# Patient Record
Sex: Male | Born: 1975 | Race: White | Hispanic: No | Marital: Single | Smoking: Current every day smoker
Health system: Southern US, Community
[De-identification: ages and names within clinical notes are randomized; demographics above are authoritative.]

## PROBLEM LIST (undated history)

## (undated) DIAGNOSIS — F1910 Other psychoactive substance abuse, uncomplicated: Secondary | ICD-10-CM

## (undated) HISTORY — PX: FRACTURE SURGERY: SHX138

---

## 2016-10-14 ENCOUNTER — Encounter: Payer: Self-pay | Admitting: Emergency Medicine

## 2016-10-14 ENCOUNTER — Emergency Department: Payer: Self-pay

## 2016-10-14 ENCOUNTER — Emergency Department (HOSPITAL_COMMUNITY)
Admission: EM | Admit: 2016-10-14 | Discharge: 2016-10-15 | Disposition: A | Discharge status: Home / Self Care | Payer: Self-pay | Attending: Emergency Medicine | Admitting: Emergency Medicine

## 2016-10-14 ENCOUNTER — Emergency Department
Admission: EM | Admit: 2016-10-14 | Discharge: 2016-10-14 | Disposition: A | Discharge status: Home / Self Care | Payer: Self-pay | Attending: Emergency Medicine | Admitting: Emergency Medicine

## 2016-10-14 ENCOUNTER — Encounter (HOSPITAL_COMMUNITY): Payer: Self-pay

## 2016-10-14 DIAGNOSIS — F17200 Nicotine dependence, unspecified, uncomplicated: Secondary | ICD-10-CM | POA: Insufficient documentation

## 2016-10-14 DIAGNOSIS — F1410 Cocaine abuse, uncomplicated: Secondary | ICD-10-CM | POA: Diagnosis present

## 2016-10-14 DIAGNOSIS — F14151 Cocaine abuse with cocaine-induced psychotic disorder with hallucinations: Secondary | ICD-10-CM

## 2016-10-14 DIAGNOSIS — F341 Dysthymic disorder: Secondary | ICD-10-CM

## 2016-10-14 DIAGNOSIS — R45851 Suicidal ideations: Secondary | ICD-10-CM

## 2016-10-14 DIAGNOSIS — R911 Solitary pulmonary nodule: Secondary | ICD-10-CM

## 2016-10-14 DIAGNOSIS — R079 Chest pain, unspecified: Secondary | ICD-10-CM

## 2016-10-14 DIAGNOSIS — F1910 Other psychoactive substance abuse, uncomplicated: Secondary | ICD-10-CM

## 2016-10-14 DIAGNOSIS — F17210 Nicotine dependence, cigarettes, uncomplicated: Secondary | ICD-10-CM | POA: Insufficient documentation

## 2016-10-14 HISTORY — DX: Other psychoactive substance abuse, uncomplicated: F19.10

## 2016-10-14 LAB — LIPASE, BLOOD: Lipase: 21 U/L (ref 11–51)

## 2016-10-14 LAB — COMPREHENSIVE METABOLIC PANEL
ALBUMIN: 4.3 g/dL (ref 3.5–5.0)
ALK PHOS: 62 U/L (ref 38–126)
ALK PHOS: 67 U/L (ref 38–126)
ALT: 21 U/L (ref 17–63)
ALT: 21 U/L (ref 17–63)
ANION GAP: 9 (ref 5–15)
AST: 18 U/L (ref 15–41)
AST: 19 U/L (ref 15–41)
Albumin: 4.3 g/dL (ref 3.5–5.0)
Anion gap: 10 (ref 5–15)
BILIRUBIN TOTAL: 0.6 mg/dL (ref 0.3–1.2)
BUN: 14 mg/dL (ref 6–20)
BUN: 12 mg/dL (ref 6–20)
CALCIUM: 9.3 mg/dL (ref 8.9–10.3)
CALCIUM: 9.3 mg/dL (ref 8.9–10.3)
CHLORIDE: 101 mmol/L (ref 101–111)
CO2: 23 mmol/L (ref 22–32)
CO2: 26 mmol/L (ref 22–32)
CREATININE: 0.77 mg/dL (ref 0.61–1.24)
Chloride: 105 mmol/L (ref 101–111)
Creatinine, Ser: 0.89 mg/dL (ref 0.61–1.24)
GFR calc Af Amer: >60 mL/min (ref >60)
GFR calc Af Amer: >60 mL/min (ref >60)
GFR calc non Af Amer: >60 mL/min (ref >60)
GLUCOSE: 97 mg/dL (ref 65–99)
Glucose, Bld: 85 mg/dL (ref 65–99)
Potassium: 3.8 mmol/L (ref 3.5–5.1)
Potassium: 3.6 mmol/L (ref 3.5–5.1)
SODIUM: 137 mmol/L (ref 135–145)
Sodium: 137 mmol/L (ref 135–145)
Total Bilirubin: 1.4 mg/dL — ABNORMAL HIGH (ref 0.3–1.2)
Total Protein: 7.5 g/dL (ref 6.5–8.1)
Total Protein: 7.5 g/dL (ref 6.5–8.1)

## 2016-10-14 LAB — RAPID URINE DRUG SCREEN, HOSP PERFORMED
AMPHETAMINES: NOT DETECTED
Barbiturates: NOT DETECTED
Benzodiazepines: POSITIVE — AB
Cocaine: POSITIVE — AB
Opiates: NOT DETECTED
TETRAHYDROCANNABINOL: POSITIVE — AB

## 2016-10-14 LAB — CBC WITH DIFFERENTIAL/PLATELET
Basophils Absolute: 0.1 10*3/uL (ref 0.0–0.1)
Basophils Relative: 0 %
Eosinophils Absolute: 0.2 10*3/uL (ref 0.0–0.7)
Eosinophils Relative: 2 %
HEMATOCRIT: 45.5 % (ref 39.0–52.0)
HEMOGLOBIN: 15.7 g/dL (ref 13.0–17.0)
LYMPHS ABS: 2.6 10*3/uL (ref 0.7–4.0)
Lymphocytes Relative: 22 %
MCH: 31.6 pg (ref 26.0–34.0)
MCHC: 34.5 g/dL (ref 30.0–36.0)
MCV: 91.5 fL (ref 78.0–100.0)
MONO ABS: 0.8 10*3/uL (ref 0.1–1.0)
MONOS PCT: 7 %
NEUTROS ABS: 8 10*3/uL — AB (ref 1.7–7.7)
NEUTROS PCT: 69 %
Platelets: 305 10*3/uL (ref 150–400)
RBC: 4.97 MIL/uL (ref 4.22–5.81)
RDW: 12.8 % (ref 11.5–15.5)
WBC: 11.7 10*3/uL — ABNORMAL HIGH (ref 4.0–10.5)

## 2016-10-14 LAB — CBC
HEMATOCRIT: 45.1 % (ref 40.0–52.0)
HEMOGLOBIN: 15.7 g/dL (ref 13.0–18.0)
MCH: 31.8 pg (ref 26.0–34.0)
MCHC: 34.9 g/dL (ref 32.0–36.0)
MCV: 91.2 fL (ref 80.0–100.0)
Platelets: 324 10*3/uL (ref 150–440)
RBC: 4.95 MIL/uL (ref 4.40–5.90)
RDW: 13.2 % (ref 11.5–14.5)
WBC: 13.7 10*3/uL — AB (ref 3.8–10.6)

## 2016-10-14 LAB — TROPONIN I

## 2016-10-14 LAB — GLUCOSE, CAPILLARY
GLUCOSE-CAPILLARY: 93 mg/dL (ref 65–99)
Glucose-Capillary: 88 mg/dL (ref 65–99)

## 2016-10-14 LAB — ETHANOL

## 2016-10-14 MED ORDER — IOPAMIDOL (ISOVUE-370) INJECTION 76%: 75.0000 mL | Freq: Once | INTRAVENOUS | Status: DC | PRN

## 2016-10-14 MED ORDER — SODIUM CHLORIDE 0.9 % IV BOLUS (SEPSIS)
500.0000 mL | Freq: Once | INTRAVENOUS | Status: AC
  Administered 2016-10-14: 500 mL via INTRAVENOUS

## 2016-10-14 MED ORDER — LORAZEPAM 2 MG/ML IJ SOLN
1.0000 mg | Freq: Once | INTRAMUSCULAR | Status: AC
  Administered 2016-10-14: 1 mg via INTRAVENOUS
  Filled 2016-10-14: qty 1

## 2016-10-14 MED ORDER — NITROGLYCERIN 2 % TD OINT
1.0000 [in_us] | TOPICAL_OINTMENT | TRANSDERMAL | Status: AC
  Administered 2016-10-14: 1 [in_us] via TOPICAL
  Filled 2016-10-14: qty 1

## 2016-10-14 MED ORDER — IOPAMIDOL (ISOVUE-370) INJECTION 76%
75.0000 mL | Freq: Once | INTRAVENOUS | Status: AC | PRN
Start: 2016-10-14 — End: 2016-10-14
  Administered 2016-10-14: 75 mL via INTRAVENOUS

## 2016-10-14 MED ORDER — LORAZEPAM 2 MG/ML IJ SOLN
1.0000 mg | Freq: Once | INTRAMUSCULAR | Status: AC
  Administered 2016-10-14: 1 mg via INTRAMUSCULAR
  Filled 2016-10-14: qty 1

## 2016-10-14 NOTE — ED Notes (Signed)
Pt inquiring about rehab. RN asked Dr. Fanny BienQuale, Dr. Fanny BienQuale advised that pt should call [facility] to see if he can get back in. Pt informed that he needed to contact [facility]. Pt states that he knows that they will not take him back since he left last night and used again. Pt upset and left room without signing for d/c. Pt did take d/c papers with him.

## 2016-10-14 NOTE — BH Assessment (Addendum)
Tele Assessment Note   Christopher [name] is an 41 y.o. male, who presents voluntary and unaccompanied to [facility]. Pt reported, a week ago he took 20, 50mg  Adderall tablets as a suicide attempt. Pt reported, he did not seek medical attention. Pt reported, current passive suicidal thoughts with no plan. Pt reported, his family (wife, mother, three boys, dad, step-dad and brother) were killed in a car accident about a year ago, while he was at work out of town. Pt reported, hearing the voice of his kids saying, "daddy," however no one is there. Pt reported, slitting his wrist and stabbing himself in the stomach. Pt reported, a previous suicide attempt in 2004 where he hung himself and had to be revived. Pt denies, HI, access to weapons.   Pt denied abuse.  Pt reported, using two grams of cocaine, and drinking a case of beer and half a fifth of Christiane Ha, last night. Pt reported, smoking, two and a half pack of cigarettes, and smoking 5-6 joint, daily. Pt's UDS and BAL are pending. Pt denied being linked to OPT resources (medication management and/or counseling.) Pt reported, previous inpatient admissions. Pt reported, in 2005, he was admitted to [facility], he stayed there for a while then relapsed. Pt reported, he got clean again however, after thinking about his family, the pt relapsed. Pt reported, leaving [facility] because he was in his own head. Pt reported, he can not come back to [facility] for 30 days.   Pt presented crying in scrubs with logical/coherent speech. Pt's eye contact was good. Pt's mood was depressed/helpess. Pt's affect was congruent with mood. Pt's thought process was coherent/relavent. Pt's judgement was partial. Pt's concentration was normal. Pt's insight was fair. Pt;'s impulse control was poor. Pt was oriented x3, (year, city and state.) Pt reported, if discharged from [facility] he could not contract for safety. Pt reported, if inpatient treatment was recommended would sign-in voluntarily.   Diagnosis:  Substance-Induced Mood Disorder.  Past Medical History:  Past Medical History:  Diagnosis Date  . Substance abuse     Past Surgical History:  Procedure Laterality Date  . FRACTURE SURGERY      Family History: History reviewed. No pertinent family history.  Social History:  reports that he has been smoking.  He uses smokeless tobacco. He reports that he drinks alcohol. He reports that he uses drugs, including Cocaine.  Additional Social History:  Alcohol / Drug Use Pain Medications: See MAR Prescriptions: See MAR Over the Counter: See MAR History of alcohol / drug use?: Yes Substance #1 Name of Substance 1: Marijuana 1 - Age of First Use: UTA 1 - Amount (size/oz): Pt reported, smoking 5-6 joints, daily.  1 - Frequency: UTA 1 - Duration: UTA 1 - Last Use / Amount: Pt reported, daily.  Substance #2 Name of Substance 2: Cocaine 2 - Age of First Use: UTA 2 - Amount (size/oz): Pt reported, using two grams of cocaine, last night.  2 - Frequency: UTA 2 - Duration: UTA 2 - Last Use / Amount: Pt reported, last night. Substance #3 Name of Substance 3: Alcohol 3 - Age of First Use: UTA 3 - Amount (size/oz): Pt reported, drinking a case of beer and half fifth of Christiane Ha, last night. 3 - Frequency: UTA 3 - Duration: UTA 3 - Last Use / Amount: Pt reported, last night.  Substance #4 Name of Substance 4: Cigarettes 4 - Age of First Use: UTA 4 - Amount (size/oz): Pt reportedm smoking, two and a half  pack of cigarettes, daily.  4 - Frequency: UTA 4 - Duration: UTA 4 - Last Use / Amount: Pt reported, daily.   CIWA: CIWA-Ar BP: 134/88 Pulse Rate: 92 COWS:    PATIENT STRENGTHS: (choose at least two) Average or above average intelligence General fund of knowledge Motivation for treatment/growth  Allergies:  Allergies  Allergen Reactions  . Ibuprofen Hives    Swelling and hives    Home Medications:  (Not in a hospital admission)  OB/GYN Status:  No LMP for male  patient.  General Assessment Data Location of Assessment: [facility] Assessment: In system Is this a Tele or Face-to-Face Assessment?: Face-to-Face Is this an Initial Assessment or a Re-assessment for this encounter?: Initial Assessment Marital status: Widowed Living Arrangements: Alone Can pt return to current living arrangement?: Yes Admission Status: Voluntary Is patient capable of signing voluntary admission?: Yes Referral Source: Self/Family/Friend Insurance type: Self-pay     Crisis Care Plan Living Arrangements: Alone Legal Guardian: Other: (Self) Name of Psychiatrist: NA Name of Therapist: NA  Education Status Is patient currently in school?: No Current Grade: NA Highest grade of school patient has completed: 12th grade.  Name of school: NA Contact person: NA  Risk to self with the past 6 months Suicidal Ideation: No-Not Currently/Within Last 6 Months Has patient been a risk to self within the past 6 months prior to admission? : Yes Suicidal Intent: No-Not Currently/Within Last 6 Months Has patient had any suicidal intent within the past 6 months prior to admission? : Yes Is patient at risk for suicide?: No Suicidal Plan?: No-Not Currently/Within Last 6 Months Has patient had any suicidal plan within the past 6 months prior to admission? : Yes Access to Means: Yes Specify Access to Suicidal Means: Adderall tablets.  What has been your use of drugs/alcohol within the last 12 months?: Alcohol, cocaine, marijuana and cigarettes. Previous Attempts/Gestures: Yes How many times?: 2 Other Self Harm Risks: Cutting his wrists.  Triggers for Past Attempts: Unpredictable Intentional Self Injurious Behavior: Cutting Comment - Self Injurious Behavior: Pt reported, sliting his wrists.  Family Suicide History: Unable to assess Recent stressful life event(s): Loss (Comment) (family died in car accident. ) Persecutory voices/beliefs?: No Depression: Yes Depression Symptoms:  Loss of interest in usual pleasures, Guilt, Isolating, Tearfulness, Fatigue, Feeling worthless/self pity Substance abuse history and/or treatment for substance abuse?: Yes Suicide prevention information given to non-admitted patients: Not applicable  Risk to Others within the past 6 months Homicidal Ideation: No (Pt denies. ) Does patient have any lifetime risk of violence toward others beyond the six months prior to admission? : No Thoughts of Harm to Others: No Current Homicidal Intent: No Current Homicidal Plan: No Access to Homicidal Means: No Identified Victim: NA History of harm to others?: No Assessment of Violence:  (UTA) Violent Behavior Description: Pt reported, getting into fights.  Does patient have access to weapons?: No Criminal Charges Pending?: No Does patient have a court date: No Is patient on probation?: No  Psychosis Hallucinations: Auditory Delusions: None noted  Mental Status Report Appearance/Hygiene: In scrubs Eye Contact: Good Motor Activity: Unremarkable Speech: Logical/coherent Level of Consciousness: Crying Mood: Depressed, Helpless, Sad Affect: Other (Comment) ( congruent with mood.) Anxiety Level: Moderate Thought Processes: Coherent, Relevant Judgement: Partial Orientation: Other (Comment) (year, city and state. ) Obsessive Compulsive Thoughts/Behaviors: None  Cognitive Functioning Concentration: Normal Memory: Recent Intact IQ: Average Insight: Fair Impulse Control: Poor Appetite: Poor Weight Loss:  (UTA) Weight Gain:  (UTA) Sleep: Decreased Total  Hours of Sleep:  (UTA) Vegetative Symptoms: None  ADLScreening [facility] Assessment Services) Patient's cognitive ability adequate to safely complete daily activities?: Yes Patient able to express need for assistance with ADLs?: Yes Independently performs ADLs?: Yes (appropriate for developmental age)  Prior Inpatient Therapy Prior Inpatient Therapy: Yes Prior Therapy Dates: 2005,  2018. Prior Therapy Facilty/Provider(s):  [facility], [facility]. Reason for Treatment: substance abuse treatment.  Prior Outpatient Therapy Prior Outpatient Therapy: No Prior Therapy Dates: NA Prior Therapy Facilty/Provider(s): NA Reason for Treatment: NA Does patient have an ACCT team?: No Does patient have Intensive In-House Services?  : No Does patient have [facility] services? : No Does patient have P4CC services?: No  ADL Screening (condition at time of admission) Patient's cognitive ability adequate to safely complete daily activities?: Yes Is the patient deaf or have difficulty hearing?: No Does the patient have difficulty seeing, even when wearing glasses/contacts?: No Does the patient have difficulty concentrating, remembering, or making decisions?: Yes Patient able to express need for assistance with ADLs?: Yes Does the patient have difficulty dressing or bathing?: No Independently performs ADLs?: Yes (appropriate for developmental age) Does the patient have difficulty walking or climbing stairs?: No Weakness of Legs: Both Weakness of Arms/Hands: Left       Abuse/Neglect Assessment (Assessment to be complete while patient is alone) Physical Abuse: Denies (Pt denies. ) Verbal Abuse: Denies (Pt denies.) Sexual Abuse: Denies (Pt denies.) Exploitation of patient/patient's resources: Denies (Pt denies.) Self-Neglect: Denies (Pt denies.)     Advance Directives (For Healthcare) Does Patient Have a Medical Advance Directive?: No Would patient like information on creating a medical advance directive?: No - Patient declined    Additional Information 1:1 In Past 12 Months?: No CIRT Risk: No Elopement Risk: No Does patient have medical clearance?: Yes     Disposition: Nira Conn, NP recommends overnight observation and re-evaluation the morning. Disposition discussed with Huntley Dec, RN.    Disposition Initial Assessment Completed for this Encounter: Yes Disposition of Patient: Other  dispositions (AM Psychiatric Evaluation. ) Other disposition(s): Other (Comment) (AM Psychiatric Evaluation. )  Redmond Pulling 10/14/2016 11:20 PM   Redmond Pulling, MS, [facility], [facility] Triage Specialist [phone]

## 2016-10-14 NOTE — ED Provider Notes (Signed)
[facility]-EMERGENCY DEPT Provider Note   CSN: [id number] Arrival date & time: 10/14/16  1720     History   Chief Complaint Chief Complaint  Patient presents with  . Depression    HPI Christopher [name] is a 41 y.o. male.  HPI  Patient presents with concern of depression, suicidal ideation and anxiety. Patient notes a history of polysubstance abuse, states that he was clean until the past 2 days. During that time he has been using cocaine, alcohol, marijuana. One week ago the patient made a suicide attempt, taking approximately 20 tablets of Adderall. Today, he denies suicide plan, states that he feels as though he wants to end it all, feels depressed. Notably, the patient was seen at our affiliated facility earlier today, evaluated for chest pain.  He notes that since that time he has had worsening depression, and wants get help (with both depression and substance abuse).    Past Medical History:  Diagnosis Date  . Substance abuse     There are no active problems to display for this patient.   Past Surgical History:  Procedure Laterality Date  . FRACTURE SURGERY         Home Medications    Prior to Admission medications   Not on File    Family History History reviewed. No pertinent family history.  Social History Social History  Substance Use Topics  . Smoking status: Current Every Day Smoker  . Smokeless tobacco: Current User  . Alcohol use Yes     Allergies   Ibuprofen   Review of Systems Review of Systems  Constitutional:       Per HPI, otherwise negative  HENT:       Per HPI, otherwise negative  Respiratory:       Per HPI, otherwise negative  Cardiovascular:       Per HPI, otherwise negative  Gastrointestinal: Negative for vomiting.  Endocrine:       Negative aside from HPI  Genitourinary:       Neg aside from HPI   Musculoskeletal:       Per HPI, otherwise negative  Skin: Negative.   Neurological: Negative for syncope.    Psychiatric/Behavioral: Positive for dysphoric mood, sleep disturbance and suicidal ideas. The patient is nervous/anxious.      Physical Exam Updated Vital Signs BP (!) 141/94 (BP Location: Left Arm)   Pulse (!) 112   Temp 98.1 F (36.7 C) (Oral)   Resp 16   SpO2 98%   Physical Exam  Constitutional: He is oriented to person, place, and time. He appears well-developed. No distress.  HENT:  Head: Normocephalic and atraumatic.  Eyes: Conjunctivae and EOM are normal.  Cardiovascular: Normal rate and regular rhythm.   Pulmonary/Chest: Effort normal. No stridor. No respiratory distress.  Abdominal: He exhibits no distension.  Musculoskeletal: He exhibits no edema.  Neurological: He is alert and oriented to person, place, and time.  Skin: Skin is warm and dry.  Psychiatric: His mood appears anxious. He exhibits a depressed mood. He expresses suicidal ideation. He expresses no suicidal plans.  Nursing note and vitals reviewed.    ED Treatments / Results  Labs (all labs ordered are listed, but only abnormal results are displayed) Labs Reviewed  COMPREHENSIVE METABOLIC PANEL - Abnormal; Notable for the following:       Result Value   Total Bilirubin 1.4 (*)    All other components within normal limits  RAPID URINE DRUG SCREEN, HOSP PERFORMED - Abnormal; Notable for the  following:    Cocaine POSITIVE (*)    Benzodiazepines POSITIVE (*)    Tetrahydrocannabinol POSITIVE (*)    All other components within normal limits  CBC WITH DIFFERENTIAL/PLATELET - Abnormal; Notable for the following:    WBC 11.7 (*)    Neutro Abs 8.0 (*)    All other components within normal limits  ETHANOL     Radiology Dg Chest 1 View  Result Date: 10/14/2016 CLINICAL DATA:  Chest pain.  Cocaine last night EXAM: CHEST 1 VIEW COMPARISON:  None. FINDINGS: Normal heart size and mediastinal contours. No acute infiltrate or edema. No effusion or pneumothorax. No acute osseous findings. IMPRESSION: Negative  portable chest. Electronically Signed   By: Marnee SpringJonathon  Watts M.D.   On: 10/14/2016 08:27   Ct Angio Chest Aorta W And/or Wo Contrast  Result Date: 10/14/2016 CLINICAL DATA:  Chest pain, history of cocaine and ethanol use with cocaine most recently taken 1 hour ago, possible aortic disease, history smoking EXAM: CT ANGIOGRAPHY CHEST WITH CONTRAST TECHNIQUE: Multidetector CT imaging of the chest was performed using the standard protocol during bolus administration of intravenous contrast. Multiplanar CT image reconstructions and MIPs were obtained to evaluate the vascular anatomy. CONTRAST:  75 cc Isovue 370 IV COMPARISON:  None FINDINGS: Cardiovascular: Precontrast images demonstrate normal aortic caliber without evidence of intramural hematoma or surrounding hemorrhage. Normal aortic enhancement post contrast. No evidence of aortic aneurysm or dissection. Pulmonary artery suboptimally opacified on aortic exam, grossly unremarkable. No pericardial effusion. Mediastinum/Nodes: Small hiatal hernia. Esophagus questionably demonstrates mild wall thickening versus artifact from underdistention. Minimally enlarged lymph node adjacent to RIGHT mainstem bronchus 11 mm short axis image 42. No additional thoracic adenopathy. Base of cervical region unremarkable. Lungs/Pleura: Minimal dependent atelectasis at lung bases. 2 mm RIGHT lower lobe nodule image 26. Mild central peribronchial thickening. No pulmonary infiltrate, pleural effusion, or pneumothorax. Upper Abdomen: Normal appearance Musculoskeletal: Unremarkable Review of the MIP images confirms the above findings. IMPRESSION: Minimal dependent atelectasis in the lower lobes. Single nonspecific minimally enlarged 4R RIGHT lymph node. 2 mm RIGHT lower lobe nodule, recommendation below. No follow-up needed if patient is low-risk. Non-contrast chest CT can be considered in 12 months if patient is high-risk. This recommendation follows the consensus statement: Guidelines  for Management of Incidental Pulmonary Nodules Detected on CT Images: From the Fleischner Society 2017; Radiology 2017; 284:228-243. Electronically Signed   By: Ulyses SouthwardMark  Boles M.D.   On: 10/14/2016 09:46    Procedures Procedures (including critical care time)  Medications Ordered in ED Medications  LORazepam (ATIVAN) injection 1 mg (not administered)     Initial Impression / Assessment and Plan / ED Course  I have reviewed the triage vital signs and the nursing notes.  Pertinent labs & imaging results that were available during my care of the patient were reviewed by me and considered in my medical decision making (see chart for details).  EMR reviewed, notable for ACS / PE eval w reassuring results.  Patient with history of depression, polysubstance abuse presents after episode of suicide attempt one week ago, now with ongoing suicidal ideation, depression, and substance abuse Patient has multiple risk factors for suicide.  Final Clinical Impressions(s) / ED Diagnoses  Depression Polysubstance abuse   Gerhard MunchLockwood, Darryle Dennie, MD 10/14/16 2359

## 2016-10-14 NOTE — ED Triage Notes (Signed)
Pt went to [facility] and discharge.  Wants rehab for beer, cocaine, adderall.  Recent death in family causing relapse.

## 2016-10-14 NOTE — ED Notes (Signed)
TTS at bedside. 

## 2016-10-14 NOTE — ED Triage Notes (Signed)
[place] [facility] [place] Per EMS pt c/o chest pain 7/10. Pt was given 324 mg of ASA by EMS. On arrival pt admits to using cocaine and ETOH. Pt states that he was in rehab for 3 days but left last night and was using cocaine and drinking, pt states that he did about 2 grams of cocaine, last used about 1 hour PTA. Pt states that he drank approximately a case of beer.

## 2016-10-14 NOTE — Discharge Instructions (Addendum)
You have been seen in the Emergency Department (ED) today for chest pain likely due to your cocaine use.  As we have discussed today?s test results are normal, but you may require further testing. STOP using cocaine, this could lead to a massive heart attack and death.  Please follow up with the recommended doctor as instructed above in these documents regarding today?s emergent visit and your recent symptoms to discuss further management.  Continue to take your regular medications. If you are not doing so already, please also take a daily baby aspirin (81 mg), at least until you follow up with your doctor.  Return to the Emergency Department (ED) if you experience any further chest pain/pressure/tightness, difficulty breathing, or sudden sweating, or other symptoms that concern you.    Please follow-up with a primary care doctor within 12 months for repeat CT scan of the lungs to make sure that the nodule seen in the right lung is not increasing in size. This is very important to ensure it is not becoming precancerous.

## 2016-10-14 NOTE — ED Provider Notes (Signed)
[facility] Emergency Department Provider Note   ____________________________________________   First MD Initiated Contact with Patient 10/14/16 [phone]     (approximate)  I have reviewed the triage vital signs and the nursing notes.   HISTORY  Chief Complaint Chest Pain    HPI Christopher [name] is a 41 y.o. male began having chest pain about one hour ago  Patient reports that he drank a few beers last night, then used cocaine and began experiencing chest pain that only a few minutes of starting cocaine. He then walked to the train station but his pain worsened prompting a call to 911.  Reports he was recently in rehabilitation and went through 3 days of rehabilitation and detox for alcohol, opioids, and cocaine abuse and left the program last night.  Reports he began experiencing a pain in the mid chest that radiated slightly towards the left arm. He then began experiencing pain seemed to move towards his back and is now seated as a sharp discomfort and pain in his left shoulder blade area. Reports he's never had symptoms like this before. He has no known medical problems except for substance abuse history.  Reports he had some nausea associated but no vomiting.  Currently reports pain is moderate to severe and located in the left back and shoulder blade area   Past Medical History:  Diagnosis Date  . Substance abuse     There are no active problems to display for this patient.   Past Surgical History:  Procedure Laterality Date  . FRACTURE SURGERY      Prior to Admission medications   Not on File  None  Allergies Ibuprofen  No family history on file.  Social History Social History  Substance Use Topics  . Smoking status: Current Every Day Smoker  . Smokeless tobacco: Current User  . Alcohol use Yes    Review of Systems Constitutional: No fever/chills Eyes: No visual changes. ENT: No sore throat. Cardiovascular: See history of  present illness Respiratory: Denies shortness of breath. Gastrointestinal: No abdominal pain.   No diarrhea.  No constipation. Genitourinary: Negative for dysuria. Musculoskeletal: Negative for back pain. Skin: Negative for rash. Neurological: Negative for headaches, focal weakness or numbness.    ____________________________________________   PHYSICAL EXAM:  VITAL SIGNS: ED Triage Vitals  Enc Vitals Group     BP 10/14/16 0742 131/88     Pulse Rate 10/14/16 0742 (!) 124     Resp 10/14/16 0742 13     Temp 10/14/16 0742 98.7 F (37.1 C)     Temp Source 10/14/16 0742 Oral     SpO2 10/14/16 0739 94 %     Weight 10/14/16 0743 215 lb (97.5 kg)     Height 10/14/16 0743 6\' 2"  (1.88 m)     Head Circumference --      Peak Flow --      Pain Score 10/14/16 0741 7     Pain Loc --      Pain Edu? --      Excl. in GC? --     Constitutional: Alert and oriented. Appears in pain, eyes or wincing. Reports pain in the left back and shoulder area at this time. No falls or injury. Eyes: Conjunctivae are normal. Head: Atraumatic. Nose: No congestion/rhinnorhea. Mouth/Throat: Mucous membranes are moist. Neck: No stridor.  Denies any pain in the neck. Cardiovascular: Tachycardic rate, regular rhythm. Grossly normal heart sounds.  Good peripheral circulation. No reproducible chest pain to palpation. Respiratory:  Normal respiratory effort.  No retractions. Lungs CTAB. Gastrointestinal: Soft and nontender. No distention. Musculoskeletal: No lower extremity tenderness nor edema. Neurologic:  Normal speech and language. No gross focal neurologic deficits are appreciated.  Skin:  Skin is warm, dry and intact. No rash noted. The small abrasion is noted to the right lower shin without signs of complication or superinfection. Psychiatric: Mood and affect are normal. Speech and behavior are normal.  ____________________________________________   LABS (all labs ordered are listed, but only abnormal  results are displayed)  Labs Reviewed  CBC - Abnormal; Notable for the following:       Result Value   WBC 13.7 (*)    All other components within normal limits  TROPONIN I  TROPONIN I  COMPREHENSIVE METABOLIC PANEL  LIPASE, BLOOD  GLUCOSE, CAPILLARY  GLUCOSE, CAPILLARY  CBG MONITORING, ED   ____________________________________________  EKG  ED ECG REPORT I, Elya Diloreto, the attending physician, personally viewed and interpreted this ECG.  Date: 10/14/2016 EKG Time: 740 Rate: 115 Rhythm: normal sinus rhythm QRS Axis: normal Intervals: normal ST/T Wave abnormalities: normal Narrative Interpretation: unremarkable  ____________________________________________  RADIOLOGY  Dg Chest 1 View  Result Date: 10/14/2016 CLINICAL DATA:  Chest pain.  Cocaine last night EXAM: CHEST 1 VIEW COMPARISON:  None. FINDINGS: Normal heart size and mediastinal contours. No acute infiltrate or edema. No effusion or pneumothorax. No acute osseous findings. IMPRESSION: Negative portable chest. Electronically Signed   By: Marnee SpringJonathon  Watts M.D.   On: 10/14/2016 08:27   Ct Angio Chest Aorta W And/or Wo Contrast  Result Date: 10/14/2016 CLINICAL DATA:  Chest pain, history of cocaine and ethanol use with cocaine most recently taken 1 hour ago, possible aortic disease, history smoking EXAM: CT ANGIOGRAPHY CHEST WITH CONTRAST TECHNIQUE: Multidetector CT imaging of the chest was performed using the standard protocol during bolus administration of intravenous contrast. Multiplanar CT image reconstructions and MIPs were obtained to evaluate the vascular anatomy. CONTRAST:  75 cc Isovue 370 IV COMPARISON:  None FINDINGS: Cardiovascular: Precontrast images demonstrate normal aortic caliber without evidence of intramural hematoma or surrounding hemorrhage. Normal aortic enhancement post contrast. No evidence of aortic aneurysm or dissection. Pulmonary artery suboptimally opacified on aortic exam, grossly  unremarkable. No pericardial effusion. Mediastinum/Nodes: Small hiatal hernia. Esophagus questionably demonstrates mild wall thickening versus artifact from underdistention. Minimally enlarged lymph node adjacent to RIGHT mainstem bronchus 11 mm short axis image 42. No additional thoracic adenopathy. Base of cervical region unremarkable. Lungs/Pleura: Minimal dependent atelectasis at lung bases. 2 mm RIGHT lower lobe nodule image 26. Mild central peribronchial thickening. No pulmonary infiltrate, pleural effusion, or pneumothorax. Upper Abdomen: Normal appearance Musculoskeletal: Unremarkable Review of the MIP images confirms the above findings. IMPRESSION: Minimal dependent atelectasis in the lower lobes. Single nonspecific minimally enlarged 4R RIGHT lymph node. 2 mm RIGHT lower lobe nodule, recommendation below. No follow-up needed if patient is low-risk. Non-contrast chest CT can be considered in 12 months if patient is high-risk. This recommendation follows the consensus statement: Guidelines for Management of Incidental Pulmonary Nodules Detected on CT Images: From the Fleischner Society 2017; Radiology 2017; 284:228-243. Electronically Signed   By: Ulyses SouthwardMark  Boles M.D.   On: 10/14/2016 09:46    ____________________________________________   PROCEDURES  Procedure(s) performed: None  Procedures  Critical Care performed: none  ____________________________________________   INITIAL IMPRESSION / ASSESSMENT AND PLAN / ED COURSE  Pertinent labs & imaging results that were available during my care of the patient were reviewed by me and considered in  my medical decision making (see chart for details).  Patient presents for evaluation of chest pain. The etiology and description of the pain is concerning for possible coronary syndrome, but given his associated cocaine use is also highly suspect for vasospasm and/or dissection. No obtain CT angiography of the chest to exclude dissection. He is been  given aspirin by EMS  His initial EKG shows no evidence of ischemic changes. We'll await lab testing. Reevaluation. Treat with Ativan, nitrates at this time.    ----------------------------------------- 11:41 AM on 10/14/2016 -----------------------------------------  Repeat EKG performed at 12:10 AM Heart rate 80 QRS 80 QTc 4:30 Normal sinus rhythm, no evidence of ischemia or ectopy noted  Patient is resting comfortably, pain-free at this time.   Discussed the patient's clinical history, labs and CT scan with Dr. Kirke Corin of cardiology. He advises discharging the patient to follow-up and discontinuation of cocaine. Patient was counseled on this, notify the cocaine may lead to massive heart attack and death, patient knowledge is this and is agreeable with plan for discharge and follow-up.   ____________________________________________   FINAL CLINICAL IMPRESSION(S) / ED DIAGNOSES  Final diagnoses:  Chest pain  Lung nodule  Cocaine abuse      NEW MEDICATIONS STARTED DURING THIS VISIT:  New Prescriptions   No medications on file     Note:  This document was prepared using Dragon voice recognition software and may include unintentional dictation errors.     Sharyn Creamer, MD 10/14/16 [phone]

## 2016-10-14 NOTE — ED Triage Notes (Signed)
Per EMS, pt from truck stop.  Pt seen earlier at [facility]. Per note seen for cocaine and drinking.  Pt states he wants help.  Pt having nausea and left arm pain.  Vitals:  138/106, hr 15, 98% ra

## 2016-10-15 ENCOUNTER — Ambulatory Visit (HOSPITAL_COMMUNITY)
Admission: RE | Admit: 2016-10-15 | Discharge: 2016-10-15 | Disposition: A | Discharge status: Home / Self Care | Payer: Self-pay | Attending: Psychiatry | Admitting: Psychiatry

## 2016-10-15 ENCOUNTER — Encounter (HOSPITAL_COMMUNITY): Payer: Self-pay | Admitting: Emergency Medicine

## 2016-10-15 DIAGNOSIS — F14151 Cocaine abuse with cocaine-induced psychotic disorder with hallucinations: Secondary | ICD-10-CM

## 2016-10-15 DIAGNOSIS — F17210 Nicotine dependence, cigarettes, uncomplicated: Secondary | ICD-10-CM

## 2016-10-15 DIAGNOSIS — F1410 Cocaine abuse, uncomplicated: Secondary | ICD-10-CM | POA: Diagnosis present

## 2016-10-15 DIAGNOSIS — F1910 Other psychoactive substance abuse, uncomplicated: Secondary | ICD-10-CM

## 2016-10-15 MED ORDER — LORAZEPAM 1 MG PO TABS: 1.0000 mg | ORAL_TABLET | Freq: Four times a day (QID) | ORAL | Status: DC | PRN

## 2016-10-15 NOTE — ED Notes (Signed)
Hourly rounding reveals patient sleeping in room. No complaints, stable, in no acute distress. Q15 minute rounds and monitoring via [employer] Cam[employer] to continue. 

## 2016-10-15 NOTE — ED Notes (Signed)
[facility] Expected date:  Expected time:  Means of arrival:  Comments: Margo AyeHall C

## 2016-10-15 NOTE — [facility] Suicide Risk Assessment (Signed)
Suicide Risk Assessment  Discharge Assessment   [facility]   Principal Problem: Cocaine abuse with cocaine-induced psychotic disorder [facility](HCC) Discharge Diagnoses:  Patient Active Problem List   Diagnosis Date Noted  . Cocaine abuse with cocaine-induced psychotic disorder [facility](HCC) [F14.159] 10/15/2016    Priority: High    Total Time spent with patient: 45 minutes  Musculoskeletal: Strength & Muscle Tone: within normal limits Gait & Station: normal Patient leans: N/A  Psychiatric Specialty Exam: Physical Exam  Constitutional: He is oriented to person, place, and time. He appears well-developed and well-nourished.  HENT:  Head: Normocephalic.  Neck: Normal range of motion.  Respiratory: Effort normal.  Musculoskeletal: Normal range of motion.  Neurological: He is alert and oriented to person, place, and time.  Psychiatric: He has a normal mood and affect. His speech is normal and behavior is normal. Judgment and thought content normal. Cognition and memory are normal.    Review of Systems  Psychiatric/Behavioral: Positive for substance abuse.  All other systems reviewed and are negative.   Blood pressure 126/73, pulse 91, temperature 97.8 F (36.6 C), temperature source Oral, resp. rate 18, SpO2 92 %.There is no height or weight on file to calculate BMI.  General Appearance: Casual  Eye Contact:  Good  Speech:  Normal Rate  Volume:  Normal  Mood:  Euthymic  Affect:  Congruent  Thought Process:  Coherent and Descriptions of Associations: Intact  Orientation:  Full (Time, Place, and Person)  Thought Content:  WDL and Logical  Suicidal Thoughts:  No  Homicidal Thoughts:  No  Memory:  Immediate;   Good Recent;   Good Remote;   Good  Judgement:  Fair  Insight:  Fair  Psychomotor Activity:  Normal  Concentration:  Concentration: Good and Attention Span: Good  Recall:  Good  Fund of Knowledge:  Fair  Language:  Good  Akathisia:  No  Handed:   Right  AIMS (if indicated):     Assets:  Leisure Time Physical Health Resilience Social Support  ADL's:  Intact  Cognition:  WNL  Sleep:       Mental Status Per Nursing Assessment::   On Admission:   cocaine abuse with hallucinations  Demographic Factors:  Male and Caucasian  Loss Factors: Legal issues  Historical Factors: NA  Risk Reduction Factors:   Sense of responsibility to family, Living with another person, especially a relative and Positive social support  Continued Clinical Symptoms:  None  Cognitive Features That Contribute To Risk:  None    Suicide Risk:  Minimal: No identifiable suicidal ideation.  Patients presenting with no risk factors but with morbid ruminations; may be classified as minimal risk based on the severity of the depressive symptoms    Plan Of Care/Follow-up recommendations:  Activity:  as tolerated Diet:  heart healthy diet  Dmario Russom, NP 10/15/2016, 11:44 AM

## 2016-10-15 NOTE — ED Notes (Signed)
Up to the bathroom 

## 2016-10-15 NOTE — ED Notes (Signed)
Pt eating lunch prior to leaving

## 2016-10-15 NOTE — ED Notes (Addendum)
Pt ambulatory w/o difficulty to dc area, belonging returned after leaving the area, buss pass given.

## 2016-10-15 NOTE — BH Assessment (Signed)
Tele Assessment Note  Pt presents for assessment at [facility]. He was d/c from [facility] a couple of hours ago. PT is cooperative and oriented x 4. His affect is depressed and he appears tearful. Pt sts his entire family died in a [place]/21 [place] (wife, mom, 3 boys, dad, stepdad and brother). Later in assessment, pt says his mom recently died from thyroid cancer. Pt currently denies SI. He reports one suicide attempt in 2005 when he tried to hang himself and had to be revived. 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 Pt reports he can hear his three sons calling "daddy". Pt does not appear to be responding to internal stimuli and exhibits no delusional thought. Pt's reality testing appears to be intact. Pt denies homicidal thoughts or physical aggression. Pt denies having access to firearms. Pt denies having any legal problems at this time. Pt reports longest amount of clean and sober time is 2.5 yrs. PT reports recently using cocaine, etoh and THC. He says he has a bed at a facility in [place] but has no transportation there. Pt sts he doesn't want to be inpatient. He says he simply needs transportation. He denies access to weapons.    Christopher [name] is an 41 y.o. male.   Diagnosis:  Major Depressive Disorder, Single Episode, Severe with Psychotic Features   Past Medical History:  Past Medical History:  Diagnosis Date  . Substance abuse     Past Surgical History:  Procedure Laterality Date  . FRACTURE SURGERY      Family History: No family history on file.  Social History:  reports that he has been smoking.  He uses smokeless tobacco. He reports that he drinks alcohol. He reports that he uses drugs, including Cocaine, Benzodiazepines, and Marijuana.  Additional Social History:  Alcohol / Drug Use Pain Medications: pt denies abuse Prescriptions: pt reports benzo abuse Over the Counter: pt denies abuse History of alcohol / drug use?:  Yes Longest period of sobriety (when/how long): 2.5 yrs Negative Consequences of Use: Financial, Work / Programmer, multimedia, Personal relationships Substance #1 Name of Substance 1: cannabis 1 - Amount (size/oz): 5 to 6 joints 1 - Frequency: daily 1 - Last Use / Amount: 10/05/16 Substance #2 Name of Substance 2: cocaine 2 - Amount (size/oz): varies 2 - Last Use / Amount: 10/13/16 - 2 grams Substance #3 Name of Substance 3: etoh 3 - Amount (size/oz): varies 3 - Duration: years 3 - Last Use / Amount: 10/13/16 - case of beer and half fifth of Christiane Ha  CIWA: CIWA-Ar BP: 120/75 Pulse Rate: (!) 102 COWS:    PATIENT STRENGTHS: (choose at least two) Average or above average intelligence Capable of independent living Communication skills Physical Health  Allergies:  Allergies  Allergen Reactions  . Ibuprofen Hives    Swelling and hives    Home Medications:  (Not in a hospital admission)  OB/GYN Status:  No LMP for male patient.  General Assessment Data Location of Assessment: [facility] Assessment Services TTS Assessment: In system Is this a Tele or Face-to-Face Assessment?: Face-to-Face Is this an Initial Assessment or a Re-assessment for this encounter?: Initial Assessment Marital status: Widowed [place] name: n/a Is patient pregnant?: No Pregnancy Status: No Living Arrangements: Alone Can pt return to current living arrangement?: Yes (homeless) Admission Status: Voluntary Is patient capable of signing voluntary admission?: Yes Referral Source: Self/Family/Friend Insurance type: self pay  Medical Screening Exam [facility] Walk-in ONLY) Medical  Exam completed: Yes  Crisis Care Plan Living Arrangements: Alone Name of Psychiatrist: n Name of Therapist: n  Education Status Is patient currently in school?: No Highest grade of school patient has completed: 12  Risk to self with the past 6 months Suicidal Ideation: No Has patient been a risk to self within the past 6 months prior to  admission? : No Suicidal Intent: No Has patient had any suicidal intent within the past 6 months prior to admission? : No Is patient at risk for suicide?: No Suicidal Plan?: No Has patient had any suicidal plan within the past 6 months prior to admission? : No Access to Means: No What has been your use of drugs/alcohol within the last 12 months?: alcohol, coke and thc Previous Attempts/Gestures: Yes How many times?: 1 (2004 by hanging) Intentional Self Injurious Behavior: None Family Suicide History: Yes (pt sts dad died from suicide) Recent stressful life event(s): Loss (Comment) (deaths of 8 members of family in mvc last year) Persecutory voices/beliefs?: No Depression: Yes Depression Symptoms: Loss of interest in usual pleasures, Guilt, Isolating, Tearfulness, Fatigue, Feeling worthless/self pity Substance abuse history and/or treatment for substance abuse?: Yes Suicide prevention information given to non-admitted patients: Not applicable  Risk to Others within the past 6 months Homicidal Ideation: No Does patient have any lifetime risk of violence toward others beyond the six months prior to admission? : No Thoughts of Harm to Others: No Current Homicidal Intent: No Current Homicidal Plan: No Access to Homicidal Means: No Identified Victim: none History of harm to others?: No Assessment of Violence: None Noted Violent Behavior Description: pt denies hx violence Does patient have access to weapons?: No Criminal Charges Pending?: No Does patient have a court date: No Is patient on probation?: No  Psychosis Hallucinations: Auditory (pt can hear his 3 sons saying "daddy") Delusions: None noted  Mental Status Report Appearance/Hygiene: Unremarkable (in appropriate street clothing) Eye Contact: Good Motor Activity: Freedom of movement Speech: Logical/coherent Level of Consciousness: Crying, Alert Mood: Depressed, Sad, Anhedonia Affect: Appropriate to circumstance, Sad,  Depressed Anxiety Level: Minimal Thought Processes: Coherent, Relevant Judgement: Unimpaired Orientation: Place, Time, Situation, Person Obsessive Compulsive Thoughts/Behaviors: None  Cognitive Functioning Concentration: Normal Memory: Recent Intact, Remote Intact IQ: Average Insight: Poor Impulse Control: Poor Appetite: Poor Sleep: Decreased Total Hours of Sleep: 2 Vegetative Symptoms: None  ADLScreening [facility]) Patient's cognitive ability adequate to safely complete daily activities?: Yes Patient able to express need for assistance with ADLs?: Yes Independently performs ADLs?: Yes (appropriate for developmental age)  Prior Inpatient Therapy Prior Inpatient Therapy: Yes Prior Therapy Dates: 2005 & 2018 Prior Therapy Facilty/Provider(s): [facility] Reason for Treatment: substance abuse  Prior Outpatient Therapy Prior Outpatient Therapy: No Does patient have an ACCT team?: No Does patient have Intensive In-House Services?  : No Does patient have [facility] services? : No Does patient have P4CC services?: No  ADL Screening (condition at time of admission) Patient's cognitive ability adequate to safely complete daily activities?: Yes Patient able to express need for assistance with ADLs?: Yes Independently performs ADLs?: Yes (appropriate for developmental age)       Abuse/Neglect Assessment (Assessment to be complete while patient is alone) Physical Abuse: Denies Verbal Abuse: Denies Sexual Abuse: Denies Exploitation of patient/patient's resources: Denies          Additional Information 1:1 In Past 12 Months?: No CIRT Risk: No Elopement Risk: No Does patient have medical clearance?: No     Disposition:  Disposition Initial Assessment  Completed for this Encounter: Yes Disposition of Patient: Outpatient treatment   Ferne ReusJustina Okonkwo NP recommends outpatient treatment for pt. He declined [place] bus pass. He says he [place] [facility]   Olga Seyler P 10/15/2016 3:45 PM

## 2016-10-15 NOTE — Discharge Instructions (Signed)
For your ongoing mental health needs, you are advised to follow up with [facility].  [facility] [address] [place], [place] [address] Office: [phone]   Fax: [phone] Outpatient Counseling

## 2016-10-15 NOTE — ED Notes (Addendum)
Written dc instructions, OP and residential treatment referrals, and local resources reviewed with pt.  Pt encouraged to contact OP/residential resources for treatment. Pt also encouraged to seek treatment for return of suicidal thoughts/urges.  Pt verbalized understanding.

## 2016-10-15 NOTE — H&P (Signed)
Behavioral Health Medical Screening Exam  Christopher [name] is an 41 y.o. male [facility] Patient reporting needing transportation to get to [place] for substance abuse treatment. Patient denies any SI/HI/VAH.   Total Time spent with patient: 30 minutes  Psychiatric Specialty Exam: Physical Exam  Constitutional: He is oriented to person, place, and time. He appears well-developed and well-nourished.  HENT:  Head: Normocephalic.  Eyes: Pupils are equal, round, and reactive to light.  Neck: Normal range of motion.  Cardiovascular: Normal rate and regular rhythm.   Respiratory: Effort normal and breath sounds normal.  GI: Soft.  Musculoskeletal: Normal range of motion.  Neurological: He is alert and oriented to person, place, and time.  Skin: Skin is warm and dry.    Review of Systems  Psychiatric/Behavioral: Positive for depression and substance abuse. Negative for hallucinations, memory loss and suicidal ideas. The patient is not nervous/anxious and does not have insomnia.   All other systems reviewed and are negative.   Blood pressure 120/75, pulse (!) 102, temperature 98.4 F (36.9 C), temperature source Oral, resp. rate 16, SpO2 100 %.There is no height or weight on file to calculate BMI.  General Appearance: Casual  Eye Contact:  Good  Speech:  Clear and Coherent and Normal Rate  Volume:  Normal  Mood:  Anxious and appropriate  Affect:  Congruent  Thought Process:  Coherent and Goal Directed  Orientation:  Full (Time, Place, and Person)  Thought Content:  WDL and Logical  Suicidal Thoughts:  No  Homicidal Thoughts:  No  Memory:  Immediate;   Good Recent;   Good Remote;   Fair  Judgement:  Intact  Insight:  Present  Psychomotor Activity:  Normal  Concentration: Concentration: Good and Attention Span: Good  Recall:  Good  Fund of Knowledge:Good  Language: Good  Akathisia:  Negative  Handed:  Right  AIMS  (if indicated):     Assets:  Communication Skills Desire for Improvement Leisure Time Physical Health Resilience  Sleep:       Musculoskeletal: Strength & Muscle Tone: within normal limits Gait & Station: normal Patient leans: N/A  Blood pressure 120/75, pulse (!) 102, temperature 98.4 F (36.9 C), temperature source Oral, resp. rate 16, SpO2 100 %.  Recommendations:  Based on my evaluation the patient does not appear to have an emergency medical condition.  City bus pass provided.  Delila PereyraJustina A Rupinder Livingston, NP 10/15/2016, 2:36 PM
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
## 2016-10-15 NOTE — ED Notes (Signed)
Report to include Situation, Background, Assessment, and Recommendations received from Sara RN. Patient alert and oriented, warm and dry, in no acute distress. Patient denies SI, HI, AVH and pain. Patient made aware of Q15 minute rounds and security cameras for their safety. Patient instructed to come to me with needs or concerns.  

## 2016-10-15 NOTE — ED Notes (Signed)
Up tot he bathroom to shower and change scrubs 

## 2016-10-17 ENCOUNTER — Telehealth: Payer: Self-pay

## 2016-10-17 NOTE — Telephone Encounter (Signed)
Lmov for patient to call back and schedule appointment he was seen in ED on 10/14/16 for CP °Will try again at a later time °

## 2016-10-20 NOTE — Telephone Encounter (Signed)
Lmov for patient to call back and schedule appointment he was seen in ED on 10/14/16 for CP Will try again at a later time

## 2016-10-26 NOTE — Telephone Encounter (Signed)
Lmov for patient to call back and schedule appointment he was seen in ED on 10/14/16 for CP Will try again at a later time

## 2017-10-20 ENCOUNTER — Encounter (HOSPITAL_COMMUNITY): Payer: Self-pay

## 2017-10-20 ENCOUNTER — Inpatient Hospital Stay (HOSPITAL_COMMUNITY)
Admission: AD | Admit: 2017-10-20 | Discharge: 2017-10-21 | DRG: 885 | Disposition: A | Discharge status: Home / Self Care | Payer: Federal, State, Local not specified - Other | Source: Intra-hospital | Attending: Psychiatry | Admitting: Psychiatry

## 2017-10-20 ENCOUNTER — Other Ambulatory Visit: Payer: Self-pay

## 2017-10-20 DIAGNOSIS — R45851 Suicidal ideations: Secondary | ICD-10-CM | POA: Diagnosis present

## 2017-10-20 DIAGNOSIS — F1410 Cocaine abuse, uncomplicated: Secondary | ICD-10-CM | POA: Diagnosis present

## 2017-10-20 DIAGNOSIS — F17200 Nicotine dependence, unspecified, uncomplicated: Secondary | ICD-10-CM | POA: Diagnosis present

## 2017-10-20 DIAGNOSIS — F1520 Other stimulant dependence, uncomplicated: Secondary | ICD-10-CM | POA: Diagnosis present

## 2017-10-20 DIAGNOSIS — F10239 Alcohol dependence with withdrawal, unspecified: Secondary | ICD-10-CM | POA: Diagnosis not present

## 2017-10-20 DIAGNOSIS — G4700 Insomnia, unspecified: Secondary | ICD-10-CM | POA: Diagnosis present

## 2017-10-20 DIAGNOSIS — F322 Major depressive disorder, single episode, severe without psychotic features: Secondary | ICD-10-CM | POA: Diagnosis present

## 2017-10-20 DIAGNOSIS — F419 Anxiety disorder, unspecified: Secondary | ICD-10-CM | POA: Diagnosis present

## 2017-10-20 DIAGNOSIS — F332 Major depressive disorder, recurrent severe without psychotic features: Secondary | ICD-10-CM | POA: Diagnosis present

## 2017-10-20 DIAGNOSIS — F1220 Cannabis dependence, uncomplicated: Secondary | ICD-10-CM | POA: Diagnosis present

## 2017-10-20 MED ORDER — LOPERAMIDE HCL 2 MG PO CAPS: 2.0000 mg | ORAL_CAPSULE | ORAL | Status: DC | PRN

## 2017-10-20 MED ORDER — VITAMIN B-1 100 MG PO TABS
100.0000 mg | ORAL_TABLET | Freq: Every day | ORAL | Status: DC
  Filled 2017-10-20 (×3): qty 1

## 2017-10-20 MED ORDER — CHLORDIAZEPOXIDE HCL 25 MG PO CAPS
25.0000 mg | ORAL_CAPSULE | Freq: Four times a day (QID) | ORAL | Status: DC | PRN
  Administered 2017-10-20: 25 mg via ORAL
  Filled 2017-10-20: qty 1

## 2017-10-20 MED ORDER — CHLORDIAZEPOXIDE HCL 25 MG PO CAPS: 25.0000 mg | ORAL_CAPSULE | ORAL | Status: DC

## 2017-10-20 MED ORDER — NICOTINE 21 MG/24HR TD PT24
21.0000 mg | MEDICATED_PATCH | Freq: Every day | TRANSDERMAL | Status: DC
  Administered 2017-10-20 – 2017-10-21 (×2): 21 mg via TRANSDERMAL
  Filled 2017-10-20 (×5): qty 1

## 2017-10-20 MED ORDER — CHLORDIAZEPOXIDE HCL 25 MG PO CAPS: 25.0000 mg | ORAL_CAPSULE | Freq: Every day | ORAL | Status: DC

## 2017-10-20 MED ORDER — MAGNESIUM HYDROXIDE 400 MG/5ML PO SUSP
30.0000 mL | Freq: Every day | ORAL | Status: DC | PRN
  Administered 2017-10-20: 30 mL via ORAL
  Filled 2017-10-20: qty 30

## 2017-10-20 MED ORDER — THIAMINE HCL 100 MG/ML IJ SOLN
100.0000 mg | Freq: Once | INTRAMUSCULAR | Status: AC
  Administered 2017-10-20: 100 mg via INTRAMUSCULAR
  Filled 2017-10-20: qty 2

## 2017-10-20 MED ORDER — TRAZODONE HCL 50 MG PO TABS
50.0000 mg | ORAL_TABLET | Freq: Every evening | ORAL | Status: DC | PRN
  Administered 2017-10-20: 50 mg via ORAL
  Filled 2017-10-20: qty 7
  Filled 2017-10-20: qty 1

## 2017-10-20 MED ORDER — ADULT MULTIVITAMIN W/MINERALS CH
1.0000 | ORAL_TABLET | Freq: Every day | ORAL | Status: DC
  Filled 2017-10-20 (×4): qty 1

## 2017-10-20 MED ORDER — ACETAMINOPHEN 325 MG PO TABS
650.0000 mg | ORAL_TABLET | Freq: Four times a day (QID) | ORAL | Status: DC | PRN
  Administered 2017-10-20: 650 mg via ORAL
  Filled 2017-10-20: qty 2

## 2017-10-20 MED ORDER — HYDROXYZINE HCL 25 MG PO TABS
25.0000 mg | ORAL_TABLET | Freq: Four times a day (QID) | ORAL | Status: DC | PRN
  Administered 2017-10-20: 25 mg via ORAL
  Filled 2017-10-20: qty 1

## 2017-10-20 MED ORDER — CHLORDIAZEPOXIDE HCL 25 MG PO CAPS: 25.0000 mg | ORAL_CAPSULE | Freq: Three times a day (TID) | ORAL | Status: DC

## 2017-10-20 MED ORDER — CHLORDIAZEPOXIDE HCL 25 MG PO CAPS: 25.0000 mg | ORAL_CAPSULE | Freq: Four times a day (QID) | ORAL | Status: DC

## 2017-10-20 MED ORDER — HYDROXYZINE HCL 25 MG PO TABS: 25.0000 mg | ORAL_TABLET | Freq: Three times a day (TID) | ORAL | Status: DC | PRN

## 2017-10-20 MED ORDER — ALUM & MAG HYDROXIDE-SIMETH 200-200-20 MG/5ML PO SUSP: 30.0000 mL | ORAL | Status: DC | PRN

## 2017-10-20 MED ORDER — ONDANSETRON 4 MG PO TBDP: 4.0000 mg | ORAL_TABLET | Freq: Four times a day (QID) | ORAL | Status: DC | PRN
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
## 2017-10-20 NOTE — Tx Team (Signed)
Initial Treatment Plan 10/20/2017 5:42 PM Christopher [name] [id number]    PATIENT STRESSORS: Substance abuse   PATIENT STRENGTHS: Average or above average intelligence Capable of independent living Communication skills   PATIENT IDENTIFIED PROBLEMS:     "I'm needing to get my life back in line"    " I want to get started"              DISCHARGE CRITERIA:  Adequate post-discharge living arrangements Improved stabilization in mood, thinking, and/or behavior Motivation to continue treatment in a less acute level of care  PRELIMINARY DISCHARGE PLAN: Attend 12-step recovery group Outpatient therapy Return to previous living arrangement Return to previous work or school arrangements  PATIENT/FAMILY INVOLVEMENT: This treatment plan has been presented to and reviewed with the patient, Christopher [name], .  The patient has been given the opportunity to ask questions and make suggestions.  Christopher NevinValerie S Manuelita Moxon, RN 10/20/2017, 5:42 PM

## 2017-10-20 NOTE — Progress Notes (Signed)
The patient attended the evening A.A.meeting and was appropriate.  

## 2017-10-20 NOTE — Progress Notes (Signed)
Patient is a 42 y/o male admitted voluntarily from [facility] for SI. Pt reports being intoxicated  when he was making threats to shoot himself and denies SI/HI currently. Pt states that he just wants to "get help with his problem". Pt reports that he lives in [place] and has sought help there- stating that he 'has been accepted' into '[facility]", reporting that they told him to first check into ED. Pt presents with an anxious affect, mildly irritated mood upon initial interaction. Pt was calm and cooperative- answering questions appropriately throughout admission interview. Pt denies A/V hallucinations and withdrawal symptoms at this time-. VS obtained-pt's HR 124 currently. Pt reports being anxious about coming "into a place like this". Patient reports intermittent lower back pain for approx a month. Admission paperwork completed and signed. Verbal understanding expressed. Skin assessment completed and no abnormalities noted except for old scars bilateral anterior lower legs from previous ankle surgery (2003?). Belongings searched and secured in locker #21. Patient oriented to unit. Q 15 min checks initiated for safety.
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
## 2017-10-21 MED ORDER — TRAZODONE HCL 50 MG PO TABS: 50.0000 mg | ORAL_TABLET | Freq: Every evening | ORAL | 0 refills | Status: DC | PRN

## 2017-10-21 NOTE — BHH Group Notes (Addendum)
LCSW Group Therapy Note  10/21/2017   10:00--11:00am   Type of Therapy and Topic:  Group Therapy: Anger Cues and Responses  Participation Level:  Active   Description of Group:   In this group, patients learned how to recognize the physical, cognitive, emotional, and behavioral responses they have to anger-provoking situations.  They identified a recent time they became angry and how they reacted.  They analyzed how their reaction was possibly beneficial and how it was possibly unhelpful.  The group discussed a variety of healthier coping skills that could help with such a situation in the future.  Deep breathing was practiced briefly.  Therapeutic Goals: 1. Patients will remember their last incident of anger and how they felt emotionally and physically, what their thoughts were at the time, and how they behaved. 2. Patients will identify how their behavior at that time worked for them, as well as how it worked against them. 3. Patients will explore possible new behaviors to use in future anger situations. 4. Patients will learn that anger itself is normal and cannot be eliminated, and that healthier reactions can assist with resolving conflict rather than worsening situations.  Summary of Patient Progress:  The patient shared that his most recent time of anger was [facility]. He stated that he already "detox" at another hospital and saw no reason he needed to stay. He expressed a rudimentary understanding of emotional and physical cues that occur when he is angry.  Therapeutic Modalities:   Cognitive Behavioral Therapy  Evorn Gongonnie D Alexsus Papadopoulos

## 2017-10-21 NOTE — BHH Suicide Risk Assessment (Signed)
[facility]   Principal Problem: MDD (major depressive disorder), severe [facility](HCC) Discharge Diagnoses:  Patient Active Problem List   Diagnosis Date Noted  . MDD (major depressive disorder), severe (HCC) [F32.2] 10/20/2017  . Marijuana dependence (HCC) [F12.20]   . Cocaine abuse with cocaine-induced psychotic disorder [facility](HCC) [F14.159] 10/15/2016    Total Time spent with patient: 15 minutes  Musculoskeletal: Strength & Muscle Tone: within normal limits Gait & Station: normal Patient leans: N/A  Psychiatric Specialty Exam: Review of Systems  All other systems reviewed and are negative.   Blood pressure 124/85, pulse 97, temperature 97.8 F (36.6 C), temperature source Oral, resp. rate 18, height 6\' 2"  (1.88 m), weight 102.5 kg, SpO2 98 %.Body mass index is 29.02 kg/m.  General Appearance: Casual  Eye Contact::  Fair  Speech:  Normal Rate409  Volume:  Normal  Mood:  Anxious  Affect:  Congruent  Thought Process:  Coherent and Descriptions of Associations: Intact  Orientation:  Full (Time, Place, and Person)  Thought Content:  Logical  Suicidal Thoughts:  No  Homicidal Thoughts:  No  Memory:  Immediate;   Fair Recent;   Fair Remote;   Fair  Judgement:  Intact  Insight:  Lacking  Psychomotor Activity:  Normal  Concentration:  Fair  Recall:  [employer]Fair  Fund of Knowledge:Fair  Language: Fair  Akathisia:  Negative  Handed:  Right  AIMS (if indicated):     Assets:  Communication Skills Desire for Improvement Housing Physical Health Resilience  Sleep:  Number of Hours: 6.25  Cognition: WNL  ADL's:  Intact   Mental Status Per Nursing Assessment::   On Admission:  Suicidal ideation indicated by others  Demographic Factors:  Male, Divorced or widowed, Caucasian and Low socioeconomic status  Loss Factors: NA  Historical Factors: Prior suicide attempts and Impulsivity  Risk Reduction Factors:   Positive social support  Continued Clinical  Symptoms:  Depression:   Impulsivity Alcohol/Substance Abuse/Dependencies  Cognitive Features That Contribute To Risk:  None    Suicide Risk:  Minimal: No identifiable suicidal ideation.  Patients presenting with no risk factors but with morbid ruminations; may be classified as minimal risk based on the severity of the depressive symptoms    Plan Of Care/Follow-up recommendations:  Activity:  ad lib  Antonieta PertGreg Lawson Emanuela Runnion, MD 10/21/2017, 10:54 AM

## 2017-10-21 NOTE — Progress Notes (Signed)
Patient ID: [name], male   DOB: 04/16/1975, 42 y.o.   MRN: [id number] Pt remained isolated to his room. Pt at assessment endorsed moderate anxiety and depression; "I'm getting there." Pt denied pain, SI, HI or AVH. Pt was med compliant. Support, encouragement, and safe environment provided.  15-minute safety checks continue. Pt did not attend group.

## 2017-10-21 NOTE — Progress Notes (Signed)
Patient is adamant that he be discharged today. MD agrees no data available warrants IVC. DC instrucitons reviewed with pt and pt verbalizes understanding and willingness to comply.Pt given cc of dc instructions ( AVS, SSP, transition record and SRA). All belongings are returned and pt is given 2 [place] and [place] [employer] [place] ( [place]), Pt completed daily assessment and on this he wrote he deneid SI today and he rated his depression, hopelessness and anxeity " 3/2/5", respectively.

## 2017-10-21 NOTE — BHH Suicide Risk Assessment (Signed)
[facility]   Principal Problem: MDD (major depressive disorder), severe [facility](HCC) Discharge Diagnoses:  Patient Active Problem List   Diagnosis Date Noted  . MDD (major depressive disorder), severe (HCC) [F32.2] 10/20/2017  . Marijuana dependence (HCC) [F12.20]   . Cocaine abuse with cocaine-induced psychotic disorder [facility](HCC) [F14.159] 10/15/2016    Total Time spent with patient: 15 minutes  Musculoskeletal: Strength & Muscle Tone: within normal limits Gait & Station: normal Patient leans: N/A  Psychiatric Specialty Exam: Review of Systems  All other systems reviewed and are negative.   Blood pressure 124/85, pulse 97, temperature 97.8 F (36.6 C), temperature source Oral, resp. rate 18, height 6\' 2"  (1.88 m), weight 102.5 kg, SpO2 98 %.Body mass index is 29.02 kg/m.  General Appearance: Casual  Eye Contact::  Good  Speech:  Normal Rate409  Volume:  Normal  Mood:  Irritable  Affect:  Congruent  Thought Process:  Coherent and Descriptions of Associations: Intact  Orientation:  Full (Time, Place, and Person)  Thought Content:  Logical  Suicidal Thoughts:  No  Homicidal Thoughts:  No  Memory:  Immediate;   Fair Recent;   Fair Remote;   Fair  Judgement:  Intact  Insight:  Lacking  Psychomotor Activity:  Increased  Concentration:  Good  Recall:  Good  Fund of Knowledge:Fair  Language: Fair  Akathisia:  Negative  Handed:  Right  AIMS (if indicated):     Assets:  Desire for Improvement Housing Physical Health Resilience Social Support  Sleep:  Number of Hours: 6.25  Cognition: WNL  ADL's:  Intact   Mental Status Per Nursing Assessment::   On Admission:  Suicidal ideation indicated by others  Demographic Factors:  Male, Divorced or widowed, Caucasian, Low socioeconomic status and Unemployed  Loss Factors: NA  Historical Factors: Prior suicide attempts and Impulsivity  Risk Reduction Factors:   Living with another person, especially  a relative and Positive social support  Continued Clinical Symptoms:  Depression:   Impulsivity Alcohol/Substance Abuse/Dependencies  Cognitive Features That Contribute To Risk:  None    Suicide Risk:  Minimal: No identifiable suicidal ideation.  Patients presenting with no risk factors but with morbid ruminations; may be classified as minimal risk based on the severity of the depressive symptoms    Plan Of Care/Follow-up recommendations:  Activity:  ad lib  Antonieta PertGreg Lawson Kyleen Villatoro, MD 10/21/2017, 11:22 AM

## 2017-10-21 NOTE — BHH Counselor (Signed)
Clinical Social Work Note  At doctor request, met with patient to ask about calling his family to ensure that he can get transportation from them to the rehab facility he wants to go to in [place].  During that discussion, pt stated he was going to go to a hospital in [place] that will refer him to the rehab.  CSW requested information about the name of that hospital in order to call and find out where they would refer him so that such referral could be made from here since he is inpatient already here, in order to avoid unnecessary use of another hospital service.  He adamantly and somewhat angrily refused to provide this information, saying he can self-refer and insisting that he needs to leave the hospital in order to go home and wash his clothes and take care of his animals prior to driving 7-4/9 hours to the [place] facility.  CSW explained about EMTALA law and this hospital's duty to take care of a discharge plan for him.  He refused to consent to any contact, and would not give the name of the hospital he stated he plans to go to, saying he is not in the hospital involuntarily and is not in withdrawals so does not need any assistance from this hospital at all, simply wants to be discharged.  He withdrew the consent to call his son, stating he had already done so and we would not need to call his son.  Another CSW, Elisabeth Pigeon, was present during this exchange.  Selmer Dominion, LCSW 10/21/2017, 11:28 AM

## 2017-10-21 NOTE — Progress Notes (Signed)
  [facility] :  Will you be returning to the same living situation after discharge:  Yes,  [place] in [place] and wants to return home to wash his clothes and take care of his animals At discharge, do you have transportation home?: Yes,  son Do you have the ability to pay for your medications: Yes,  N/A  Release of information consent forms completed and turned in to Medical Records by CSW.   Patient to Follow up at: Follow-up Information    Patients refuses all follow-up and states he will self-refer for a rehabilitation program in [place] Follow up.           Next level of care provider [facility]:no  Safety Planning and Suicide Prevention discussed: No.  Not needed, no SI     Has patient been referred to the Quitline?: Patient refused referral  Patient has been referred for addiction treatment: Pt. refused referral  Lynnell ChadMareida J Grossman-Orr, LCSW 10/21/2017, 11:26 AM
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
## 2019-01-04 ENCOUNTER — Ambulatory Visit (HOSPITAL_COMMUNITY)
Admission: EM | Admit: 2019-01-04 | Discharge: 2019-01-04 | Disposition: A | Discharge status: Other Acute Inpatient Hospital | Payer: Self-pay

## 2019-01-04 ENCOUNTER — Other Ambulatory Visit: Payer: Self-pay

## 2019-01-04 ENCOUNTER — Emergency Department (HOSPITAL_COMMUNITY): Payer: No Typology Code available for payment source

## 2019-01-04 ENCOUNTER — Emergency Department (HOSPITAL_COMMUNITY)
Admission: EM | Admit: 2019-01-04 | Discharge: 2019-01-04 | Disposition: A | Discharge status: Home / Self Care | Payer: No Typology Code available for payment source | Attending: Emergency Medicine | Admitting: Emergency Medicine

## 2019-01-04 ENCOUNTER — Encounter (HOSPITAL_COMMUNITY): Payer: Self-pay

## 2019-01-04 ENCOUNTER — Encounter (HOSPITAL_COMMUNITY): Payer: Self-pay | Admitting: Emergency Medicine

## 2019-01-04 DIAGNOSIS — F17220 Nicotine dependence, chewing tobacco, uncomplicated: Secondary | ICD-10-CM | POA: Insufficient documentation

## 2019-01-04 DIAGNOSIS — Z79899 Other long term (current) drug therapy: Secondary | ICD-10-CM | POA: Diagnosis not present

## 2019-01-04 DIAGNOSIS — M25551 Pain in right hip: Secondary | ICD-10-CM | POA: Insufficient documentation

## 2019-01-04 DIAGNOSIS — F17200 Nicotine dependence, unspecified, uncomplicated: Secondary | ICD-10-CM | POA: Diagnosis not present

## 2019-01-04 DIAGNOSIS — M545 Low back pain, unspecified: Secondary | ICD-10-CM

## 2019-01-04 DIAGNOSIS — T1490XA Injury, unspecified, initial encounter: Secondary | ICD-10-CM

## 2019-01-04 LAB — CBC WITH DIFFERENTIAL/PLATELET
Abs Immature Granulocytes: 0.07 10*3/uL (ref 0.00–0.07)
Basophils Absolute: 0.1 10*3/uL (ref 0.0–0.1)
Basophils Relative: 1 %
Eosinophils Absolute: 0 10*3/uL (ref 0.0–0.5)
Eosinophils Relative: 0 %
HCT: 47 % (ref 39.0–52.0)
Hemoglobin: 15.4 g/dL (ref 13.0–17.0)
Immature Granulocytes: 1 %
Lymphocytes Relative: 13 %
Lymphs Abs: 1.6 10*3/uL (ref 0.7–4.0)
MCH: 31.5 pg (ref 26.0–34.0)
MCHC: 32.8 g/dL (ref 30.0–36.0)
MCV: 96.1 fL (ref 80.0–100.0)
Monocytes Absolute: 1.3 10*3/uL — ABNORMAL HIGH (ref 0.1–1.0)
Monocytes Relative: 10 %
Neutro Abs: 9.8 10*3/uL — ABNORMAL HIGH (ref 1.7–7.7)
Neutrophils Relative %: 75 %
Platelets: 315 10*3/uL (ref 150–400)
RBC: 4.89 MIL/uL (ref 4.22–5.81)
RDW: 12 % (ref 11.5–15.5)
WBC: 12.9 10*3/uL — ABNORMAL HIGH (ref 4.0–10.5)
nRBC: 0 % (ref 0.0–0.2)

## 2019-01-04 LAB — URINALYSIS, ROUTINE W REFLEX MICROSCOPIC
Bacteria, UA: NONE SEEN
Bilirubin Urine: NEGATIVE
Glucose, UA: NEGATIVE mg/dL
Ketones, ur: NEGATIVE mg/dL
Leukocytes,Ua: NEGATIVE
Nitrite: NEGATIVE
Protein, ur: NEGATIVE mg/dL
Specific Gravity, Urine: 1.018 (ref 1.005–1.030)
pH: 5 (ref 5.0–8.0)

## 2019-01-04 LAB — I-STAT CHEM 8, ED
BUN: 12 mg/dL (ref 6–20)
Calcium, Ion: 1.26 mmol/L (ref 1.15–1.40)
Chloride: 102 mmol/L (ref 98–111)
Creatinine, Ser: 0.9 mg/dL (ref 0.61–1.24)
Glucose, Bld: 95 mg/dL (ref 70–99)
HCT: 46 % (ref 39.0–52.0)
Hemoglobin: 15.6 g/dL (ref 13.0–17.0)
Potassium: 4.4 mmol/L (ref 3.5–5.1)
Sodium: 137 mmol/L (ref 135–145)
TCO2: 25 mmol/L (ref 22–32)

## 2019-01-04 LAB — BASIC METABOLIC PANEL
Anion gap: 15 (ref 5–15)
BUN: 12 mg/dL (ref 6–20)
CO2: 21 mmol/L — ABNORMAL LOW (ref 22–32)
Calcium: 10.4 mg/dL — ABNORMAL HIGH (ref 8.9–10.3)
Chloride: 101 mmol/L (ref 98–111)
Creatinine, Ser: 0.93 mg/dL (ref 0.61–1.24)
GFR calc Af Amer: >60 mL/min (ref >60)
GFR calc non Af Amer: >60 mL/min (ref >60)
Glucose, Bld: 96 mg/dL (ref 70–99)
Potassium: 4.3 mmol/L (ref 3.5–5.1)
Sodium: 137 mmol/L (ref 135–145)

## 2019-01-04 LAB — HEPATIC FUNCTION PANEL
ALT: 54 U/L — ABNORMAL HIGH (ref 0–44)
AST: 76 U/L — ABNORMAL HIGH (ref 15–41)
Albumin: 4.4 g/dL (ref 3.5–5.0)
Alkaline Phosphatase: 60 U/L (ref 38–126)
Bilirubin, Direct: 0.1 mg/dL (ref 0.0–0.2)
Indirect Bilirubin: 0.9 mg/dL (ref 0.3–0.9)
Total Bilirubin: 1 mg/dL (ref 0.3–1.2)
Total Protein: 7.6 g/dL (ref 6.5–8.1)

## 2019-01-04 LAB — ABO/RH: ABO/RH(D): A NEG

## 2019-01-04 LAB — PROTIME-INR
INR: 1 (ref 0.8–1.2)
Prothrombin Time: 12.6 seconds (ref 11.4–15.2)

## 2019-01-04 LAB — ETHANOL: Alcohol, Ethyl (B): <10 mg/dL (ref <10)

## 2019-01-04 LAB — LACTIC ACID, PLASMA
Lactic Acid, Venous: 2 mmol/L (ref 0.5–1.9)
Lactic Acid, Venous: 1.2 mmol/L (ref 0.5–1.9)

## 2019-01-04 LAB — TYPE AND SCREEN
ABO/RH(D): A NEG
Antibody Screen: NEGATIVE

## 2019-01-04 MED ORDER — DIPHENHYDRAMINE HCL 25 MG PO CAPS
50.00 | ORAL_CAPSULE | ORAL | Status: DC
Start: ? — End: 2019-01-04

## 2019-01-04 MED ORDER — GENERIC EXTERNAL MEDICATION
5.00 | Status: DC
Start: ? — End: 2019-01-04

## 2019-01-04 MED ORDER — SALINE NASAL SPRAY 0.65 % NA SOLN
1.00 | NASAL | Status: DC
Start: ? — End: 2019-01-04

## 2019-01-04 MED ORDER — FENTANYL CITRATE (PF) 100 MCG/2ML IJ SOLN
50.0000 ug | Freq: Once | INTRAMUSCULAR | Status: AC
Start: 2019-01-04 — End: 2019-01-04
  Administered 2019-01-04: 15:00:00 50 ug via INTRAVENOUS
  Filled 2019-01-04: qty 2

## 2019-01-04 MED ORDER — LORAZEPAM 2 MG/ML IJ SOLN
2.00 | INTRAMUSCULAR | Status: DC
Start: ? — End: 2019-01-04

## 2019-01-04 MED ORDER — LOPERAMIDE HCL 2 MG PO CAPS
2.00 | ORAL_CAPSULE | ORAL | Status: DC
Start: ? — End: 2019-01-04

## 2019-01-04 MED ORDER — GABAPENTIN 300 MG PO CAPS
300.00 | ORAL_CAPSULE | ORAL | Status: DC
Start: 2019-01-03 — End: 2019-01-04

## 2019-01-04 MED ORDER — CLONIDINE HCL 0.1 MG PO TABS
0.10 | ORAL_TABLET | ORAL | Status: DC
Start: ? — End: 2019-01-04

## 2019-01-04 MED ORDER — SODIUM CHLORIDE 0.9 % IV BOLUS
1000.0000 mL | Freq: Once | INTRAVENOUS | Status: AC
  Administered 2019-01-04: 15:00:00 1000 mL via INTRAVENOUS

## 2019-01-04 MED ORDER — BISACODYL 5 MG PO TBEC
10.00 | DELAYED_RELEASE_TABLET | ORAL | Status: DC
Start: ? — End: 2019-01-04

## 2019-01-04 MED ORDER — ONDANSETRON 8 MG PO TBDP
8.00 | ORAL_TABLET | ORAL | Status: DC
Start: ? — End: 2019-01-04

## 2019-01-04 MED ORDER — LORAZEPAM 2 MG/ML IJ SOLN
1.0000 mg | Freq: Once | INTRAMUSCULAR | Status: AC
  Administered 2019-01-04: 1 mg via INTRAVENOUS
  Filled 2019-01-04: qty 1

## 2019-01-04 MED ORDER — IOHEXOL 350 MG/ML SOLN
100.0000 mL | Freq: Once | INTRAVENOUS | Status: AC | PRN
  Administered 2019-01-04: 16:00:00 100 mL via INTRAVENOUS

## 2019-01-04 MED ORDER — NICOTINE 21 MG/24HR TD PT24
1.00 | MEDICATED_PATCH | TRANSDERMAL | Status: DC
Start: 2019-01-04 — End: 2019-01-04

## 2019-01-04 MED ORDER — NICOTINE POLACRILEX 2 MG MT GUM
2.00 | CHEWING_GUM | OROMUCOSAL | Status: DC
Start: ? — End: 2019-01-04

## 2019-01-04 MED ORDER — HALOPERIDOL 5 MG PO TABS
5.00 | ORAL_TABLET | ORAL | Status: DC
Start: ? — End: 2019-01-04

## 2019-01-04 MED ORDER — HYDROMORPHONE HCL 1 MG/ML IJ SOLN
1.0000 mg | Freq: Once | INTRAMUSCULAR | Status: AC
  Administered 2019-01-04: 17:00:00 1 mg via INTRAVENOUS
  Filled 2019-01-04: qty 1

## 2019-01-04 MED ORDER — CETIRIZINE HCL 10 MG PO TABS
10.00 | ORAL_TABLET | ORAL | Status: DC
Start: ? — End: 2019-01-04

## 2019-01-04 MED ORDER — BENZOCAINE-MENTHOL 6-10 MG MT LOZG
1.00 | LOZENGE | OROMUCOSAL | Status: DC
Start: ? — End: 2019-01-04

## 2019-01-04 MED ORDER — DEXTROMETHORPHAN-GUAIFENESIN 10-100 MG/5ML PO SYRP
10.00 | ORAL_SOLUTION | ORAL | Status: DC
Start: ? — End: 2019-01-04

## 2019-01-04 MED ORDER — FLUOXETINE HCL 20 MG PO CAPS
20.00 | ORAL_CAPSULE | ORAL | Status: DC
Start: 2019-01-04 — End: 2019-01-04

## 2019-01-04 MED ORDER — METHOCARBAMOL 500 MG PO TABS: 500.0000 mg | ORAL_TABLET | Freq: Two times a day (BID) | ORAL | 0 refills | Status: DC

## 2019-01-04 MED ORDER — THIAMINE HCL 100 MG/ML IJ SOLN
100.0000 mg | Freq: Once | INTRAMUSCULAR | Status: AC
  Administered 2019-01-04: 100 mg via INTRAVENOUS
  Filled 2019-01-04: qty 2

## 2019-01-04 MED ORDER — RISPERIDONE 1 MG PO TABS
1.50 | ORAL_TABLET | ORAL | Status: DC
Start: 2019-01-03 — End: 2019-01-04

## 2019-01-04 MED ORDER — GENERIC EXTERNAL MEDICATION
30.00 | Status: DC
Start: ? — End: 2019-01-04

## 2019-01-04 MED ORDER — MP TRI-FED COLD 2.5-60 MG PO TABS
50.00 | ORAL_TABLET | ORAL | Status: DC
Start: ? — End: 2019-01-04

## 2019-01-04 MED ORDER — DIPHENHYDRAMINE HCL 50 MG/ML IJ SOLN
50.00 | INTRAMUSCULAR | Status: DC
Start: ? — End: 2019-01-04

## 2019-01-04 MED ORDER — LIDOCAINE 5 % EX PTCH: 1.0000 | MEDICATED_PATCH | CUTANEOUS | 0 refills | Status: DC

## 2019-01-04 MED ORDER — ACETAMINOPHEN 325 MG PO TABS
650.00 | ORAL_TABLET | ORAL | Status: DC
Start: ? — End: 2019-01-04

## 2019-01-04 MED ORDER — QUINTABS PO TABS
1.00 | ORAL_TABLET | ORAL | Status: DC
Start: 2019-01-04 — End: 2019-01-04

## 2019-01-04 MED ORDER — FOLIC ACID 1 MG PO TABS
1.0000 mg | ORAL_TABLET | Freq: Once | ORAL | Status: AC
  Administered 2019-01-04: 1 mg via ORAL
  Filled 2019-01-04: qty 1

## 2019-01-04 MED ORDER — LORAZEPAM 2 MG/ML IJ SOLN
1.0000 mg | Freq: Once | INTRAMUSCULAR | Status: AC
  Administered 2019-01-04: 19:00:00 1 mg via INTRAVENOUS
  Filled 2019-01-04: qty 1
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
## 2019-01-04 NOTE — ED Notes (Signed)
Patient verbalizes understanding of discharge instructions. Opportunity for questioning and answers were provided. Armband removed by staff, pt discharged from ED ambulatory.   

## 2019-01-04 NOTE — ED Notes (Signed)
XR arrived at pt room

## 2019-01-04 NOTE — ED Notes (Addendum)
Patient transported to CT 
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
## 2019-01-04 NOTE — ED Notes (Signed)
Chaplin called pt sister upon pt request. Left message. Contact [employer] (Sister)
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
## 2019-01-04 NOTE — ED Notes (Signed)
XR called again for portable to room.
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
## 2019-01-05 NOTE — ED Notes (Signed)
Remaining 1mg  of Ativan wasted with Suezanne Jacquet, Therapist, sports.

## 2019-01-05 NOTE — ED Notes (Signed)
1mg  Ativan wasted with Dorothea Ogle, RN. Pt discharged from system before waste could be made.

## 2019-01-14 ENCOUNTER — Ambulatory Visit (INDEPENDENT_AMBULATORY_CARE_PROVIDER_SITE_OTHER): Payer: Self-pay | Admitting: Internal Medicine

## 2019-01-14 ENCOUNTER — Other Ambulatory Visit: Payer: Self-pay

## 2019-01-14 VITALS — BP 135/85 | HR 87 | Temp 98.7°F | Wt 240.9 lb

## 2019-01-14 DIAGNOSIS — F4481 Dissociative identity disorder: Secondary | ICD-10-CM

## 2019-01-14 DIAGNOSIS — M545 Low back pain, unspecified: Secondary | ICD-10-CM

## 2019-01-14 DIAGNOSIS — M542 Cervicalgia: Secondary | ICD-10-CM

## 2019-01-14 DIAGNOSIS — M546 Pain in thoracic spine: Secondary | ICD-10-CM

## 2019-01-14 DIAGNOSIS — F4310 Post-traumatic stress disorder, unspecified: Secondary | ICD-10-CM

## 2019-01-14 DIAGNOSIS — F329 Major depressive disorder, single episode, unspecified: Secondary | ICD-10-CM

## 2019-01-14 DIAGNOSIS — F419 Anxiety disorder, unspecified: Secondary | ICD-10-CM

## 2019-01-14 DIAGNOSIS — M549 Dorsalgia, unspecified: Secondary | ICD-10-CM | POA: Insufficient documentation

## 2019-01-14 MED ORDER — BACLOFEN 10 MG PO TABS: 5.0000 mg | ORAL_TABLET | Freq: Three times a day (TID) | ORAL | 0 refills | Status: DC

## 2019-01-14 NOTE — Patient Instructions (Addendum)
Christopher [name],   It was a pleasure working with you today. We discussed your back pain today, and have come up with a plan to address the issue. Today we discussed starting physical therapy, starting a muscle relaxer, applying ice/heat, and continual use of your icy hot patches. We will see you again in approximately 4-5 weeks. We look forward to working with you again!

## 2019-01-14 NOTE — Assessment & Plan Note (Signed)
Patient appears to the clinic with acute back pain that appears to be musculoskeletal in origin. Pain palpated along the paraspinal muscles with no alarm signs for cord compression. Will treat conservatively and see patient in 4-6 weeks.  Plan:  - Start Baclofen 5 mg TID.  - Continue using icy/hot patches. - Referral for ambulatory PT. - Patient instructed to use hot and cold compresses.  - Follow up appointment in 4-6 weeks.

## 2019-01-14 NOTE — Progress Notes (Addendum)
   CC: Back Pain  HPI:  [name] is a 43 y.o. male, with a PMH of anxiety, depression, PTSD, split personality disorder, and bipolar disorder, who presents to the clinic with back pain. To see the management of his acute and chronic conditions, please look at the assessment and plan notes under the encounters tab.   Past Medical History:  Diagnosis Date  . Substance abuse ([place])    Review of Systems:   Review of Systems  Constitutional: Negative for chills, fever and malaise/fatigue.  HENT: Negative for hearing loss.   Eyes: Negative for blurred vision, double vision, photophobia and redness.  Respiratory: Negative for cough.   Cardiovascular: Negative for chest pain, palpitations and orthopnea.  Genitourinary: Negative for frequency and urgency.       Denies urinary retention/incontinence. Denies fecal incontinence/retention.  Musculoskeletal: Positive for back pain and neck pain. Negative for joint pain and myalgias.       Pain between the shoulder blades and lower back.   Neurological: Negative for dizziness, tingling, tremors, focal weakness and headaches.   PMH:  - ZWC:HENIDP, brother - Diabetes: N/A - Cancer: Breast, Cervical, and brain  Social History:  - Lives in a rehabilitation home - Diet is balanced - Previous 15 pack/year smoker. Quit 2 months ago.  - Previous heavy EtOH user, quit one month ago.  - Uses marijuana, no other illicit drugs.   PSH:  - Ankle crush injury- 2010  PMH:  - Anxiety - Depression - PTSD - Split Personality Disorder - Bipolar  Physical Exam:  Vitals:   01/14/19 0940  BP: 135/85  Pulse: 87  Temp: 98.7 F (37.1 C)  TempSrc: Oral  SpO2: 98%  Weight: 240 lb 14.4 oz (109.3 kg)   Physical Exam Constitutional:      General: He is not in acute distress.    Appearance: Normal appearance. He is normal weight. He is not ill-appearing or toxic-appearing.  HENT:     Head: Normocephalic and atraumatic.  Cardiovascular:   Rate and Rhythm: Normal rate and regular rhythm.     Pulses: Normal pulses.     Heart sounds: Normal heart sounds. No murmur. No friction rub. No gallop.   Pulmonary:     Effort: Pulmonary effort is normal.     Breath sounds: Normal breath sounds. No wheezing, rhonchi or rales.  Abdominal:     General: Abdomen is flat. Bowel sounds are normal. There is no distension.     Palpations: Abdomen is soft.     Tenderness: There is no abdominal tenderness. There is no guarding.  Musculoskeletal: Normal range of motion.        General: Tenderness present. No swelling or signs of injury.     Right lower leg: No edema.     Left lower leg: No edema.     Comments: Tenderness palpated along the paraspinal muscles in the thoracic and lumbar region.  5/5 strength in the upper and lower extremities bilaterally.  Neurological:     General: No focal deficit present.     Mental Status: He is alert and oriented to person, place, and time.  Psychiatric:        Mood and Affect: Mood normal.        Behavior: Behavior normal.     Assessment & Plan:   See Encounters Tab for problem based charting.  Patient seen with Dr. Daryll Drown

## 2019-01-16 NOTE — Progress Notes (Signed)
Internal Medicine Clinic Attending  I saw and evaluated the patient.  I personally confirmed the key portions of the history and exam documented by Dr. Winters and I reviewed pertinent patient test results.  The assessment, diagnosis, and plan were formulated together and I agree with the documentation in the resident's note.  

## 2019-01-28 ENCOUNTER — Ambulatory Visit: Payer: Self-pay

## 2019-01-28 ENCOUNTER — Encounter: Payer: Self-pay | Admitting: Internal Medicine

## 2019-02-26 NOTE — Addendum Note (Signed)
Addended by: Hulan Fray on: 02/26/2019 01:26 PM   Modules accepted: Orders
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
## 2021-07-02 ENCOUNTER — Encounter (HOSPITAL_COMMUNITY): Payer: Self-pay | Admitting: Student in an Organized Health Care Education/Training Program

## 2021-07-02 ENCOUNTER — Other Ambulatory Visit: Payer: Self-pay

## 2021-07-02 ENCOUNTER — Ambulatory Visit (HOSPITAL_COMMUNITY)
Admission: EM | Admit: 2021-07-02 | Discharge: 2021-07-02 | Disposition: A | Discharge status: Other Acute Inpatient Hospital | Payer: No Payment, Other | Attending: Psychiatry | Admitting: Psychiatry

## 2021-07-02 ENCOUNTER — Inpatient Hospital Stay (HOSPITAL_COMMUNITY)
Admission: AD | Admit: 2021-07-02 | Discharge: 2021-07-05 | DRG: 885 | Disposition: A | Discharge status: Home / Self Care | Payer: Federal, State, Local not specified - Other | Source: Intra-hospital | Attending: Psychiatry | Admitting: Psychiatry

## 2021-07-02 DIAGNOSIS — D751 Secondary polycythemia: Secondary | ICD-10-CM | POA: Diagnosis present

## 2021-07-02 DIAGNOSIS — R45851 Suicidal ideations: Secondary | ICD-10-CM | POA: Diagnosis present

## 2021-07-02 DIAGNOSIS — F1010 Alcohol abuse, uncomplicated: Secondary | ICD-10-CM | POA: Diagnosis present

## 2021-07-02 DIAGNOSIS — F4310 Post-traumatic stress disorder, unspecified: Secondary | ICD-10-CM | POA: Diagnosis present

## 2021-07-02 DIAGNOSIS — G4700 Insomnia, unspecified: Secondary | ICD-10-CM | POA: Diagnosis present

## 2021-07-02 DIAGNOSIS — Z20822 Contact with and (suspected) exposure to covid-19: Secondary | ICD-10-CM | POA: Insufficient documentation

## 2021-07-02 DIAGNOSIS — F1220 Cannabis dependence, uncomplicated: Secondary | ICD-10-CM | POA: Diagnosis present

## 2021-07-02 DIAGNOSIS — F17200 Nicotine dependence, unspecified, uncomplicated: Secondary | ICD-10-CM | POA: Diagnosis present

## 2021-07-02 DIAGNOSIS — Z9151 Personal history of suicidal behavior: Secondary | ICD-10-CM | POA: Diagnosis not present

## 2021-07-02 DIAGNOSIS — Z79899 Other long term (current) drug therapy: Secondary | ICD-10-CM | POA: Diagnosis not present

## 2021-07-02 DIAGNOSIS — F333 Major depressive disorder, recurrent, severe with psychotic symptoms: Principal | ICD-10-CM | POA: Diagnosis present

## 2021-07-02 DIAGNOSIS — F17210 Nicotine dependence, cigarettes, uncomplicated: Secondary | ICD-10-CM | POA: Diagnosis present

## 2021-07-02 DIAGNOSIS — F332 Major depressive disorder, recurrent severe without psychotic features: Secondary | ICD-10-CM

## 2021-07-02 DIAGNOSIS — F4010 Social phobia, unspecified: Secondary | ICD-10-CM | POA: Diagnosis present

## 2021-07-02 DIAGNOSIS — F319 Bipolar disorder, unspecified: Secondary | ICD-10-CM | POA: Insufficient documentation

## 2021-07-02 LAB — URINALYSIS, ROUTINE W REFLEX MICROSCOPIC
Bilirubin Urine: NEGATIVE
Glucose, UA: NEGATIVE mg/dL
Hgb urine dipstick: NEGATIVE
Ketones, ur: NEGATIVE mg/dL
Leukocytes,Ua: NEGATIVE
Nitrite: NEGATIVE
Protein, ur: NEGATIVE mg/dL
Specific Gravity, Urine: 1.008 (ref 1.005–1.030)
pH: 6 (ref 5.0–8.0)

## 2021-07-02 LAB — COMPREHENSIVE METABOLIC PANEL
ALT: 16 U/L (ref 0–44)
AST: 13 U/L — ABNORMAL LOW (ref 15–41)
Albumin: 3.7 g/dL (ref 3.5–5.0)
Alkaline Phosphatase: 65 U/L (ref 38–126)
Anion gap: 8 (ref 5–15)
BUN: 9 mg/dL (ref 6–20)
CO2: 31 mmol/L (ref 22–32)
Calcium: 9.2 mg/dL (ref 8.9–10.3)
Chloride: 100 mmol/L (ref 98–111)
Creatinine, Ser: 0.73 mg/dL (ref 0.61–1.24)
GFR, Estimated: >60 mL/min (ref >60)
Glucose, Bld: 77 mg/dL (ref 70–99)
Potassium: 4.3 mmol/L (ref 3.5–5.1)
Sodium: 139 mmol/L (ref 135–145)
Total Bilirubin: 0.5 mg/dL (ref 0.3–1.2)
Total Protein: 6.9 g/dL (ref 6.5–8.1)

## 2021-07-02 LAB — RAPID URINE DRUG SCREEN, HOSP PERFORMED
Amphetamines: NOT DETECTED
Barbiturates: NOT DETECTED
Benzodiazepines: NOT DETECTED
Cocaine: NOT DETECTED
Opiates: NOT DETECTED
Tetrahydrocannabinol: POSITIVE — AB

## 2021-07-02 LAB — RESP PANEL BY RT-PCR (FLU A&B, COVID) ARPGX2
Influenza A by PCR: NEGATIVE
Influenza B by PCR: NEGATIVE
SARS Coronavirus 2 by RT PCR: NEGATIVE

## 2021-07-02 LAB — CBC WITH DIFFERENTIAL/PLATELET
Abs Immature Granulocytes: 0.04 10*3/uL (ref 0.00–0.07)
Basophils Absolute: 0.1 10*3/uL (ref 0.0–0.1)
Basophils Relative: 1 %
Eosinophils Absolute: 0.1 10*3/uL (ref 0.0–0.5)
Eosinophils Relative: 1 %
HCT: 54.7 % — ABNORMAL HIGH (ref 39.0–52.0)
Hemoglobin: 17.7 g/dL — ABNORMAL HIGH (ref 13.0–17.0)
Immature Granulocytes: 0 %
Lymphocytes Relative: 26 %
Lymphs Abs: 2.7 10*3/uL (ref 0.7–4.0)
MCH: 29.5 pg (ref 26.0–34.0)
MCHC: 32.4 g/dL (ref 30.0–36.0)
MCV: 91.2 fL (ref 80.0–100.0)
Monocytes Absolute: 0.5 10*3/uL (ref 0.1–1.0)
Monocytes Relative: 5 %
Neutro Abs: 7.2 10*3/uL (ref 1.7–7.7)
Neutrophils Relative %: 67 %
Platelets: 365 10*3/uL (ref 150–400)
RBC: 6 MIL/uL — ABNORMAL HIGH (ref 4.22–5.81)
RDW: 12.7 % (ref 11.5–15.5)
WBC: 10.7 10*3/uL — ABNORMAL HIGH (ref 4.0–10.5)
nRBC: 0 % (ref 0.0–0.2)

## 2021-07-02 LAB — POC SARS CORONAVIRUS 2 AG: SARSCOV2ONAVIRUS 2 AG: NEGATIVE

## 2021-07-02 MED ORDER — ACETAMINOPHEN 325 MG PO TABS
650.0000 mg | ORAL_TABLET | Freq: Four times a day (QID) | ORAL | Status: DC | PRN
  Administered 2021-07-03 – 2021-07-04 (×2): 650 mg via ORAL
  Filled 2021-07-02 (×2): qty 2

## 2021-07-02 MED ORDER — DIPHENHYDRAMINE HCL 25 MG PO CAPS: 25.0000 mg | ORAL_CAPSULE | Freq: Four times a day (QID) | ORAL | Status: DC | PRN

## 2021-07-02 MED ORDER — ALUM & MAG HYDROXIDE-SIMETH 200-200-20 MG/5ML PO SUSP: 30.0000 mL | ORAL | Status: DC | PRN

## 2021-07-02 MED ORDER — LORAZEPAM 1 MG PO TABS: 1.0000 mg | ORAL_TABLET | ORAL | Status: DC | PRN

## 2021-07-02 MED ORDER — MAGNESIUM HYDROXIDE 400 MG/5ML PO SUSP: 30.0000 mL | Freq: Every day | ORAL | Status: DC | PRN

## 2021-07-02 MED ORDER — ACETAMINOPHEN 325 MG PO TABS: 650.0000 mg | ORAL_TABLET | Freq: Four times a day (QID) | ORAL | Status: DC | PRN

## 2021-07-02 MED ORDER — NICOTINE 14 MG/24HR TD PT24
14.0000 mg | MEDICATED_PATCH | Freq: Every day | TRANSDERMAL | Status: DC
Start: 2021-07-02 — End: 2021-07-02
  Administered 2021-07-02: 14 mg via TRANSDERMAL

## 2021-07-02 MED ORDER — ZIPRASIDONE MESYLATE 20 MG IM SOLR: 20.0000 mg | INTRAMUSCULAR | Status: DC | PRN

## 2021-07-02 MED ORDER — OLANZAPINE 5 MG PO TBDP: 5.0000 mg | ORAL_TABLET | Freq: Three times a day (TID) | ORAL | Status: DC | PRN

## 2021-07-02 MED ORDER — HYDROXYZINE HCL 25 MG PO TABS
25.0000 mg | ORAL_TABLET | Freq: Three times a day (TID) | ORAL | Status: DC | PRN
  Filled 2021-07-02: qty 1

## 2021-07-02 MED ORDER — NICOTINE 14 MG/24HR TD PT24
MEDICATED_PATCH | TRANSDERMAL | Status: AC
  Filled 2021-07-02: qty 1

## 2021-07-02 MED ORDER — ENSURE ENLIVE PO LIQD
237.0000 mL | Freq: Two times a day (BID) | ORAL | Status: DC
  Administered 2021-07-03 – 2021-07-04 (×3): 237 mL via ORAL
  Filled 2021-07-02 (×7): qty 237

## 2021-07-02 NOTE — Discharge Instructions (Signed)
You will be transferred to [facility] for inpatient treatment. ?

## 2021-07-02 NOTE — ED Notes (Signed)
Patient arrived on unit. Patient given meal. Patient calm and cooperative. Patient safe on unit with monitoring. ?
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
## 2021-07-02 NOTE — Progress Notes (Signed)
Blood collection X 2 unsuccessful . Covid specimens collect and urine specimen collected. Dash called for specimen. ?

## 2021-07-02 NOTE — Progress Notes (Signed)
Nursing report called to [facility], Christopher [name]. Transportation called ? ?

## 2021-07-02 NOTE — ED Provider Notes (Signed)
Behavioral Health Urgent Care Medical Screening Exam ? ?Patient Name: Christopher [name] ?MRN: [id number] ?Date of Evaluation: 07/02/21 ?Chief Complaint:   ?Diagnosis:  ?Final diagnoses:  ?Severe episode of recurrent major depressive disorder, without psychotic features (HCC)  ? ? ?History of Present illness: Christopher [name] is a 46 y.o. male. He reports that he has been having SI nearly constantly.  He reports that there has been no specific crisis but has had this for years.  He reports he needs to get back on his medications which he has been off of for about 8 months.  He reports that in the past he has self medicated with EtOH but states he knows if he begins drinking again he will die.  ? ?He reports PPHx of Depression, Anxiety, Bipolar Disorder, and BPD.  He reports multiple suicide attempts (latest 2015 hanging- had to be resuscitated).  He has been hospitalized multiple times and been to Detox multiple times.  He reports last being on Prozac and Gabapentin but does not remember others.  He reports last taking medications when he was last discharged from [facility] 8 months ago for Detox. ? ?Psychiatric Specialty Exam ? ?Presentation  ?General Appearance:Appropriate for Environment; Casual ? ?Eye Contact:Fair ? ?Speech:Clear and Coherent; Normal Rate ? ?Speech Volume:Normal ? ?Handedness:Right ? ? ?Mood and Affect  ?Mood:Anxious; Depressed; Hopeless ?Affect:Congruent; Depressed (near tearful) ? ?Thought Process  ?Thought Processes:Coherent ?Descriptions of Associations:Intact ? ?Orientation:Full (Time, Place and Person) ? ?Thought Content:Logical ?Has SI. No HI or AVH.  Reports paranoia but in the setting of discussing his trauma. ?Diagnosis of Schizophrenia or Schizoaffective disorder in past: No ?  Hallucinations:None ? ?Ideas of Reference:None ? ?Suicidal Thoughts:Yes, Passive ? ?Homicidal Thoughts:No ? ? ?Sensorium  ?Memory:Immediate Fair; Recent Fair ?Judgment:Fair ?Insight:Fair ? ?Executive Functions   ?Concentration:Good ?Attention Span:Good ?Recall:Good ?Fund of Knowledge:Good ?Language:Good ? ?Psychomotor Activity  ?Psychomotor Activity:Normal ? ?Assets  ?Assets:Communication Skills; Desire for Improvement; Physical Health; Housing ? ?Sleep  ?Sleep:Poor ? ? ? ? ?Physical Exam: ?Physical Exam ?Vitals and nursing note reviewed.  ?Constitutional:   ?   General: He is not in acute distress. ?   Appearance: Normal appearance. He is normal weight. He is not ill-appearing or toxic-appearing.  ?HENT:  ?   Head: Normocephalic and atraumatic.  ?Pulmonary:  ?   Effort: Pulmonary effort is normal.  ?Musculoskeletal:     ?   General: Normal range of motion.  ?Neurological:  ?   General: No focal deficit present.  ?   Mental Status: He is alert.  ? ?Review of Systems  ?Respiratory:  Negative for cough and shortness of breath.   ?Cardiovascular:  Negative for chest pain.  ?Gastrointestinal:  Negative for abdominal pain, constipation, diarrhea, nausea and vomiting.  ?Neurological:  Positive for dizziness and headaches. Negative for weakness.  ?Psychiatric/Behavioral:  Positive for depression, memory loss and suicidal ideas. Negative for hallucinations and substance abuse. The patient is nervous/anxious and has insomnia.   ?Blood pressure (!) 109/94, pulse 100, temperature 98.5 ?F (36.9 ?C), resp. rate 18, SpO2 99 %. There is no height or weight on file to calculate BMI. ? ?Musculoskeletal: ?Strength & Muscle Tone: within normal limits ?Gait & Station: normal ?Patient leans: N/A ? ? ?[facility] MSE Discharge Disposition for Follow up and Recommendations: ?Based on my evaluation I certify that psychiatric inpatient services furnished can reasonably be expected to improve the patient's condition which I recommend transfer to an appropriate accepting facility.  ?He has been offered a bed at [facility] and once  CMP, CBC, and COVID are resulted will be transferred.  ? ?Briant Cedar, MD ?07/02/2021, 3:20 PM ? ?

## 2021-07-02 NOTE — Progress Notes (Signed)
?   07/02/21 1600  ?Psych Admission Type (Psych Patients Only)  ?Admission Status Voluntary  ?Psychosocial Assessment  ?Patient Complaints Self-harm behaviors  ?Eye Contact Fair  ?Facial Expression Flat  ?Affect Depressed  ?Speech Logical/coherent  ?Interaction Assertive  ?Motor Activity Other (Comment) ?(wnl)  ?Appearance/Hygiene Disheveled  ?Behavior Characteristics Cooperative  ?Mood Pleasant  ?Thought Process  ?Coherency WDL  ?Content WDL  ?Delusions None reported or observed  ?Perception WDL  ?Hallucination None reported or observed  ?Judgment Impaired  ?Confusion None  ?Danger to Self  ?Current suicidal ideation? Passive  ?Self-Injurious Behavior No self-injurious ideation or behavior indicators observed or expressed   ?Agreement Not to Harm Self Yes  ?Description of Agreement verbal ?(v)  ?Danger to Others  ?Danger to Others None reported or observed  ? ? ?

## 2021-07-02 NOTE — ED Triage Notes (Signed)
[name] presents to [facility] voluntarily with a complaint of SI with a plan to hang himself. Pt states that he has been experiencing ongoing SI for the past month. Pt reports being diagnosed with PTSD, Bipolar, depression and anxiety. Pt reports that he has been off of his medication for 10 months. Pt states " I need my meds before something bad happens". Pt does not have a psychiatrist at the moment. Pt denies HI and AVH. ?

## 2021-07-02 NOTE — BH Assessment (Signed)
BHH Assessment Progress Note ?  ?Per Rhea Belton, MD, this pt requires psychiatric hospitalization at this time.  Danika, RN, [facility] has tentatively assigned pt to [facility] Rm 305-1 pending unremarkable results of labs that have been drawn.  Pt has signed Voluntary Admission and Consent for Treatment, as well as Consent to Release Information to no one, and signed forms have been faxed to [facility].  Dr Renaldo Fiddler and pt's nurse, Emeterio Reeve, have been notified, and Emeterio Reeve agrees to send original paperwork along with pt via [employer], and to call report to [phone] when the time comes. ? ?Doylene Canning, MA ?Behavioral Health Coordinator ?[phone] ? ?

## 2021-07-03 DIAGNOSIS — F333 Major depressive disorder, recurrent, severe with psychotic symptoms: Principal | ICD-10-CM | POA: Diagnosis present

## 2021-07-03 DIAGNOSIS — F4310 Post-traumatic stress disorder, unspecified: Secondary | ICD-10-CM | POA: Diagnosis present

## 2021-07-03 LAB — LIPID PANEL
Cholesterol: 189 mg/dL (ref 0–200)
HDL: 48 mg/dL (ref >40)
LDL Cholesterol: 122 mg/dL — ABNORMAL HIGH (ref 0–99)
Total CHOL/HDL Ratio: 3.9 RATIO
Triglycerides: 96 mg/dL (ref <150)
VLDL: 19 mg/dL (ref 0–40)

## 2021-07-03 LAB — TSH: TSH: 2.463 u[IU]/mL (ref 0.350–4.500)

## 2021-07-03 LAB — HEMOGLOBIN A1C
Hgb A1c MFr Bld: 5.6 % (ref 4.8–5.6)
Mean Plasma Glucose: 114.02 mg/dL

## 2021-07-03 MED ORDER — MELATONIN 3 MG PO TABS
3.0000 mg | ORAL_TABLET | Freq: Every evening | ORAL | Status: DC | PRN
  Administered 2021-07-03: 3 mg via ORAL
  Filled 2021-07-03: qty 1

## 2021-07-03 MED ORDER — SERTRALINE HCL 25 MG PO TABS
25.0000 mg | ORAL_TABLET | Freq: Every day | ORAL | Status: DC
  Administered 2021-07-03 – 2021-07-05 (×3): 25 mg via ORAL
  Filled 2021-07-03 (×4): qty 1

## 2021-07-03 MED ORDER — ARIPIPRAZOLE 2 MG PO TABS
2.0000 mg | ORAL_TABLET | Freq: Every day | ORAL | Status: DC
  Administered 2021-07-03 – 2021-07-05 (×3): 2 mg via ORAL
  Filled 2021-07-03 (×4): qty 1

## 2021-07-03 MED ORDER — NICOTINE 21 MG/24HR TD PT24
21.0000 mg | MEDICATED_PATCH | Freq: Every day | TRANSDERMAL | Status: DC
  Administered 2021-07-03 – 2021-07-05 (×3): 21 mg via TRANSDERMAL
  Filled 2021-07-03 (×5): qty 1

## 2021-07-03 MED ORDER — GABAPENTIN 100 MG PO CAPS
100.0000 mg | ORAL_CAPSULE | Freq: Three times a day (TID) | ORAL | Status: DC
Start: 2021-07-03 — End: 2021-07-05
  Administered 2021-07-03 – 2021-07-05 (×7): 100 mg via ORAL
  Filled 2021-07-03 (×9): qty 1

## 2021-07-03 MED ORDER — BENZTROPINE MESYLATE 0.5 MG PO TABS
0.5000 mg | ORAL_TABLET | Freq: Two times a day (BID) | ORAL | Status: DC | PRN
Start: 2021-07-03 — End: 2021-07-05
  Filled 2021-07-03: qty 14

## 2021-07-03 NOTE — Group Note (Signed)
LCSW Group Therapy Note ? ?No social work group was held today due to staffing of one social worker on Adult unit and  high number of admissions that required initial psychosocial assessments.  The following was provided to the patient in lieu of in-person group: ? ?Healthy vs. Unhealthy Supports and Coping Skills ? ? ?Unhealthy                                                              Healthy ?Works (at first) Works   ?Stops working or starts hurting Continues working  ?Fast Usually takes time to develop  ?Easy Often difficult to learn  ?Usually a habit Usually unknown, has to become a habit  ?Can do alone Often need to reach out for help   ?Leads to loss Leads to gain  ?   ?   ? ?My Unhealthy Coping Skills                                    My Healthy Coping Skills ?   ?   ?   ?   ?   ?   ?   ? ?My Unhealthy Supports                                           My Healthy Supports ?   ?   ?   ?   ?   ?   ?   ? ? ? ? ? ?Use the stationery provided to write a Goodbye Letter to one of your unhealthy coping skills or unhealthy supports. ? ?Share with other patients as you desire. ? ?Taeya Theall Grossman-Orr, LCSW ?07/03/2021  ?3:39 PM  ?   ?

## 2021-07-03 NOTE — BHH Group Notes (Signed)
Goals Group ?4/29//2023 ? ? ?Group Focus: affirmation, clarity of thought, and goals/reality orientation ?Treatment Modality:  Psychoeducation ?Interventions utilized were assignment, group exercise, and support ?Purpose: To be able to understand and verbalize the reason for their admission to the hospital. To understand that the medication helps with their chemical imbalance but they also need to work on their choices in life. To be challenged to develop [name] list of 30 positives about themselves. Also introduce the concept that "feelings" are not reality. ? ?Participation Level:  Active ? ?Participation Quality:  Appropriate ? ?Affect:  Appropriate ? ?Cognitive:  Appropriate ? ?Insight:  Improving ? ?Engagement in Group:  Engaged ? ?Additional Comments: Rates energy at [name] 10.10. Participated in the group. ? ?Christopher [name] ?

## 2021-07-03 NOTE — Progress Notes (Signed)
Pt is A&OX4, calm, denies current suicidal ideations, denies homicidal ideations, denies auditory hallucinations and denies visual hallucinations. Pt verbally agrees to approach staff if these become apparent and before harming self or others. Pt denies experiencing nightmares. Mood and affect are congruent. Pt appetite is ok. No complaints of anxiety, distress, pain and/or discomfort at this time. Pt's memory appears to be grossly intact, and Pt hasn?t displayed any injurious behaviors. Pt is medication compliant. There?s no evidence of suicidal intent. Psychomotor activity was WNL. No s/s of Parkinson, Dystonia, Akathisia and/or Tardive Dyskinesia noted.   ?

## 2021-07-03 NOTE — Progress Notes (Signed)
Adult Psychoeducational Group Note ? ?Date:  07/03/2021 ?Time:  9:31 PM ? ?Group Topic/Focus:  ?Wrap-Up Group:   The focus of this group is to help patients review their daily goal of treatment and discuss progress on daily workbooks. ? ?Participation Level:  Minimal ? ?Participation Quality:  Appropriate ? ?Affect:  Appropriate ? ?Cognitive:  Appropriate ? ?Insight: Appropriate ? ?Engagement in Group:  Engaged ? ?Modes of Intervention:  Education and Exploration ? ?Additional Comments:  Patient attended and participated in group tonight. He reports that today he learnt that he could handle any situation he is put in if he tried. ? ?Scot Dock ?07/03/2021, 9:31 PM ?
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
## 2021-07-03 NOTE — BHH Group Notes (Signed)
.  Psychoeducational Group Note ? ? ? ?Date:  4/29//23 ?Time: 1300-1400 ? ? ? ?Purpose of Group: . The group focus' on teaching patients on how to identify their needs and their Life Skills:  [name] group where two lists are made. What people need and what are things that we do that are unhealthy. The lists are developed by the patients and it is explained that we often do the actions that are not healthy to get our list of needs met. ? ?Goal:: to develop the coping skills needed to get their needs met ? ?Participation Level:  Active ? ?Participation Quality:  Appropriate ? ?Affect:  Appropriate ? ?Cognitive:  Oriented ? ?Insight:  Improving ? ?Engagement in Group:  Engaged ? ?Additional Comments: rates his energy at [name] 6/10. Participated in the group. ? ?Christopher [name] ? ?
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
## 2021-07-04 DIAGNOSIS — F17200 Nicotine dependence, unspecified, uncomplicated: Secondary | ICD-10-CM | POA: Diagnosis present

## 2021-07-04 MED ORDER — MELATONIN 3 MG PO TABS
6.0000 mg | ORAL_TABLET | Freq: Every evening | ORAL | Status: DC | PRN
  Administered 2021-07-04: 6 mg via ORAL
  Filled 2021-07-04: qty 2

## 2021-07-04 NOTE — Progress Notes (Addendum)
[facility] MD Progress Note ? ?07/04/2021 7:26 AM ?Christopher [name]  ?MRN:  [id number] ? ?Chief Complaint: SI ? ?Reason for Admission:  ?Christopher [name] is a 46 y.o. male with a history of MDD and PTSD as well as alcohol use d/o in early remisison and cannabis use d/o, who was initially admitted for inpatient psychiatric hospitalization on 07/02/2021 for management of SI and worsening depression. The patient is currently on Hospital Day 2.  ? ?Chart Review from last 24 hours:  ?The patient's chart was reviewed and nursing notes were reviewed. The patient's case was discussed in multidisciplinary team meeting. Per MAR  ? ?Information Obtained Today During Patient Interview: ?The patient was seen and evaluated on the unit. On assessment today the patient reports he is feeling much improved today.  He states that the medications have helped him feel "calm" and less nervous.  He states his mood is "good" and he would like to talk about discharging potentially tomorrow so he can return to his job.  He states that he did "toss and turn" last night despite use of melatonin.  He reports a stable appetite.  He states that his paranoid thoughts have completely resolved and he no longer feels paranoid or suspicious around peers.  He states he has been talking and sharing in groups and interacting with others without difficulty.  He states he does not feel paranoid or suspicious about potentially returning home or to work.  He denies AVH, first rank symptoms ideas of reference, SI or HI.  He states he is having no cravings for drugs and denies any physical complaints today.  He denies medication side effects. ? ?Principal Problem: MDD (major depressive disorder), recurrent, severe, with psychosis ([place]) ?Diagnosis: Principal Problem: ?  MDD (major depressive disorder), recurrent, severe, with psychosis ([place]) ?Active Problems: ?  Marijuana dependence ([place]) ?  PTSD (post-traumatic stress disorder) ?  Nicotine dependence ? ?Total Time Spent in Direct  Patient Care:  ?I personally spent 25 minutes on the unit in direct patient care. The direct patient care time included face-to-face time with the patient, reviewing the patient's chart, communicating with other professionals, and coordinating care. Greater than 50% of this time was spent in counseling or coordinating care with the patient regarding goals of hospitalization, psycho-education, and discharge planning needs. ? ? ?Past Psychiatric History: see H&P ? ?Past Medical History:  ?Past Medical History:  ?Diagnosis Date  ? Substance abuse ([place])   ?  ?Past Surgical History:  ?Procedure Laterality Date  ? FRACTURE SURGERY    ? ?Family History: see H&P ? ?Family Psychiatric  History: see H&P ? ?Social History:  ?Social History  ? ?Substance and Sexual Activity  ?Alcohol Use Not Currently  ?   ?Social History  ? ?Substance and Sexual Activity  ?Drug Use Yes  ? Types: Cocaine, Benzodiazepines, Marijuana  ? Comment: last used about 1 hour PTA  ?  ?Social History  ? ?Socioeconomic History  ? Marital status: Single  ?  Spouse name: Not on file  ? Number of children: Not on file  ? Years of education: Not on file  ? Highest education level: Not on file  ?Occupational History  ? Not on file  ?Tobacco Use  ? Smoking status: Every Day  ?  Packs/day: 1.00  ?  Years: 10.00  ?  Pack years: 10.00  ?  Types: Cigarettes  ? Smokeless tobacco: Current  ?Substance and Sexual Activity  ? Alcohol use: Not Currently  ? Drug use: Yes  ?  Types: Cocaine, Benzodiazepines, Marijuana  ?  Comment: last used about 1 hour PTA  ? Sexual activity: Not on file  ?Other Topics Concern  ? Not on file  ?Social History Narrative  ? Not on file  ? ?Social Determinants of Health  ? ?Financial Resource Strain: Not on file  ?Food Insecurity: Not on file  ?Transportation Needs: Not on file  ?Physical Activity: Not on file  ?Stress: Not on file  ?Social Connections: Not on file  ? ?Sleep: Fair per patient report ? ?Appetite:  Good ? ?Current  Medications: ?Current Facility-Administered Medications  ?Medication Dose Route Frequency Provider Last Rate Last Admin  ? acetaminophen (TYLENOL) tablet 650 mg  650 mg Oral Q6H PRN Briant Cedar, MD   650 mg at 07/03/21 2047  ? alum & mag hydroxide-simeth (MAALOX/MYLANTA) 200-200-20 MG/5ML suspension 30 mL  30 mL Oral Q4H PRN Briant Cedar, MD      ? ARIPiprazole (ABILIFY) tablet 2 mg  2 mg Oral Daily Nelda Marseille, Carmel Garfield E, MD   2 mg at 07/03/21 1255  ? benztropine (COGENTIN) tablet 0.5 mg  0.5 mg Oral BID PRN Harlow Asa, MD      ? feeding supplement (ENSURE ENLIVE / ENSURE PLUS) liquid 237 mL  237 mL Oral BID BM Nelda Marseille, Michel Eskelson E, MD   237 mL at 07/03/21 1513  ? gabapentin (NEURONTIN) capsule 100 mg  100 mg Oral TID Harlow Asa, MD   100 mg at 07/03/21 1627  ? hydrOXYzine (ATARAX) tablet 25 mg  25 mg Oral TID PRN Briant Cedar, MD      ? OLANZapine zydis (ZYPREXA) disintegrating tablet 5 mg  5 mg Oral Q8H PRN Briant Cedar, MD      ? And  ? LORazepam (ATIVAN) tablet 1 mg  1 mg Oral PRN Briant Cedar, MD      ? And  ? ziprasidone (GEODON) injection 20 mg  20 mg Intramuscular PRN Briant Cedar, MD      ? magnesium hydroxide (MILK OF MAGNESIA) suspension 30 mL  30 mL Oral Daily PRN Briant Cedar, MD      ? melatonin tablet 3 mg  3 mg Oral QHS PRN Harlow Asa, MD   3 mg at 07/03/21 2047  ? nicotine (NICODERM CQ - dosed in mg/24 hours) patch 21 mg  21 mg Transdermal Daily Harlow Asa, MD   21 mg at 07/03/21 1357  ? sertraline (ZOLOFT) tablet 25 mg  25 mg Oral Daily Viann Fish E, MD   25 mg at 07/03/21 1256  ? ? ?Lab Results:  ?Results for orders placed or performed during the hospital encounter of 07/02/21 (from the past 48 hour(s))  ?Hemoglobin A1c     Status: None  ? Collection Time: 07/03/21  6:39 AM  ?Result Value Ref Range  ? Hgb A1c MFr Bld 5.6 4.8 - 5.6 %  ?  Comment: (NOTE) ?Pre diabetes:          5.7%-6.4% ? ?Diabetes:               >6.4% ? ?Glycemic control for   <7.0% ?adults with diabetes ?  ? Mean Plasma Glucose 114.02 mg/dL  ?  Comment: Performed at [facility], [place] [address], [place], [place] [address]  ?Lipid panel     Status: Abnormal  ? Collection Time: 07/03/21  6:39 AM  ?Result Value Ref Range  ? Cholesterol 189 0 - 200 mg/dL  ?  Triglycerides 96 <150 mg/dL  ? HDL 48 >40 mg/dL  ? Total CHOL/HDL Ratio 3.9 RATIO  ? VLDL 19 0 - 40 mg/dL  ? LDL Cholesterol 122 (H) 0 - 99 mg/dL  ?  Comment:        ?Total Cholesterol/HDL:CHD Risk ?Coronary Heart Disease Risk Table ?                    Men   Women ? 1/2 Average Risk   3.4   3.3 ? Average Risk       5.0   4.4 ? 2 X Average Risk   9.6   7.1 ? 3 X Average Risk  23.4   11.0 ?       ?Use the calculated Patient Ratio ?above and the CHD Risk Table ?to determine the patient's CHD Risk. ?       ?ATP III CLASSIFICATION (LDL): ? <100     mg/dL   Optimal ? 100-129  mg/dL   Near or Above ?                   Optimal ? 130-159  mg/dL   Borderline ? 160-189  mg/dL   High ? >190     mg/dL   Very High ?Performed at [facility], [place] [address], [place], [place] [address] ?  ?TSH     Status: None  ? Collection Time: 07/03/21  6:39 AM  ?Result Value Ref Range  ? TSH 2.463 0.350 - 4.500 uIU/mL  ?  Comment: Performed by a 3rd Generation assay with a functional sensitivity of <=0.01 uIU/mL. ?Performed at [facility], [place] [address] [place], [place] [address] ?  ? ? ?Blood Alcohol level:  ?Lab Results  ?Component Value Date  ? ETH <10 01/04/2019  ? ETH <5 10/14/2016  ? ? ?Metabolic Disorder Labs: ?Lab Results  ?Component Value Date  ? HGBA1C 5.6 07/03/2021  ? MPG 114.02 07/03/2021  ? ?No results found for: PROLACTIN ?Lab Results  ?Component Value Date  ? CHOL 189 07/03/2021  ? TRIG 96 07/03/2021  ? HDL 48 07/03/2021  ? CHOLHDL 3.9 07/03/2021  ? VLDL 19 07/03/2021  ? LDLCALC 122 (H) 07/03/2021  ? ? ?Physical Findings: ?AIMS: Facial and Oral Movements ?Muscles of Facial  Expression: None, normal ?Lips and Perioral Area: None, normal ?Jaw: None, normal ?Tongue: None, normal,Extremity Movements ?Upper (arms, wrists, hands, fingers): None, normal ?Lower (legs, knees, ankles, toes): None,

## 2021-07-04 NOTE — Progress Notes (Signed)

## 2021-07-04 NOTE — BHH Group Notes (Signed)
Adult Psychoeducational Group  ?Date:  07/04/2021 ?Time:  1300-1400 ? ?Group Topic/Focus: Continuation of the group from Saturday. Looking at the lists that were created and talking about what needs to be done with the homework of 30 positives about themselves.  ?                                   Talking about taking their power back and helping themselves to develop [name] positive self esteem. ?     ?Participation Quality:  Appropriate ? ?Affect:  Appropriate ? ?Cognitive:  Oriented ? ?Insight: Improving ? ?Engagement in Group:  Engaged ? ?Modes of Intervention:  Activity, Discussion, Education, and Support ? ?Additional Comments:  Rates energy at [name] 8/10, Participated fully in the group. ? ?Christopher [name] ? ? ?

## 2021-07-04 NOTE — BHH Group Notes (Signed)
Adult Psychoeducational Group Not ?Date:  07/04/2021 ?Time:  7858-8502 ?Group Topic/Focus: PROGRESSIVE RELAXATION. A group where deep breathing is taught and tensing and relaxation muscle groups is used. Imagery is used as well.  Pts are asked to imagine 3 pillars that hold them up when they are not able to hold themselves up and to share that with the group. ? ?Participation Level:  Active ? ?Participation Quality:  Appropriate ? ?Affect:  Appropriate ? ?Cognitive:  Oriented ? ?Insight: Improving ? ?Engagement in Group:  Engaged ? ?Modes of Intervention:  Activity, Discussion, Education, and Support ? ?Additional Comments:  Rates his energy at a 10/10. States Jesus Thayer Ohm, his kids and his animals hold him up. Participated fully in the group. ? ?Vira Blanco A ? ?

## 2021-07-04 NOTE — Group Note (Signed)
[facility] LCSW Group Therapy Note ? ?Date/Time:  07/04/2021   ? ?Type of Therapy and Topic:  Group Therapy:  Using Music to Encourage Yourself ? ?Participation Level:  Active  ? ?Description of Group: ?In this process group, members listened to a variety of music through choosing from CSW's list #1 through #25.  Patients identified the messages received from those songs and how the music affected their emotions.  Patients were encouraged to use music as a coping skill at home, but to be mindful of the choices made.  Patients discussed how this knowledge can help with wellness and recovery in various ways including managing depression and anxiety as well as encouraging healthy sleep habits.   ? ?Therapeutic Goals: ?Patients will explore the impact of different songs on mood ?Patients will verbalize the thoughts they have when listening to different types of music ?Patients will identify music that is soothing to them as well as music that is energizing to them ?Patients will discuss how to use this knowledge to assist in maintaining wellness and recovery ?Patients will explore the use of music as a coping skill ?Patients will encourage one another ? ?Summary of Patient Progress:  At the beginning of group, patient expressed his mood varies according to the music he listens to, "can make me relax or make me go crazy."  He listened attentively even though he did not talk with others or joke around like they did.  He became at one particular song and said it described him.   ? ?Therapeutic Modalities: ?Solution Focused Brief Therapy ?Activity ? ? ?Selmer Dominion, LCSW ?  ?

## 2021-07-05 ENCOUNTER — Encounter (HOSPITAL_COMMUNITY): Payer: Self-pay

## 2021-07-05 ENCOUNTER — Encounter: Payer: Self-pay | Admitting: Family Medicine

## 2021-07-05 ENCOUNTER — Ambulatory Visit: Payer: Self-pay | Attending: Family Medicine | Admitting: Family Medicine

## 2021-07-05 VITALS — BP 123/85 | HR 97 | Ht 74.0 in | Wt 209.4 lb

## 2021-07-05 DIAGNOSIS — R5382 Chronic fatigue, unspecified: Secondary | ICD-10-CM

## 2021-07-05 DIAGNOSIS — F333 Major depressive disorder, recurrent, severe with psychotic symptoms: Principal | ICD-10-CM

## 2021-07-05 DIAGNOSIS — D751 Secondary polycythemia: Secondary | ICD-10-CM

## 2021-07-05 DIAGNOSIS — Z1159 Encounter for screening for other viral diseases: Secondary | ICD-10-CM

## 2021-07-05 DIAGNOSIS — F17210 Nicotine dependence, cigarettes, uncomplicated: Secondary | ICD-10-CM

## 2021-07-05 DIAGNOSIS — F322 Major depressive disorder, single episode, severe without psychotic features: Secondary | ICD-10-CM

## 2021-07-05 MED ORDER — NICOTINE 21 MG/24HR TD PT24: 21.0000 mg | MEDICATED_PATCH | Freq: Every day | TRANSDERMAL | 0 refills | Status: DC

## 2021-07-05 MED ORDER — GABAPENTIN 100 MG PO CAPS: 100.0000 mg | ORAL_CAPSULE | Freq: Three times a day (TID) | ORAL | 0 refills | Status: AC

## 2021-07-05 MED ORDER — SERTRALINE HCL 25 MG PO TABS: 25.0000 mg | ORAL_TABLET | Freq: Every day | ORAL | 0 refills | Status: AC

## 2021-07-05 MED ORDER — NICOTINE 21 MG/24HR TD PT24: 21.0000 mg | MEDICATED_PATCH | Freq: Every day | TRANSDERMAL | 0 refills | Status: AC

## 2021-07-05 MED ORDER — ARIPIPRAZOLE 2 MG PO TABS
2.0000 mg | ORAL_TABLET | Freq: Every day | ORAL | 0 refills | Status: AC
Start: 2021-07-06 — End: 2021-08-05

## 2021-07-05 NOTE — BHH Suicide Risk Assessment (Signed)
BHH INPATIENT:  Family/Significant Other Suicide Prevention Education ? ?Suicide Prevention Education:  ?Education Completed; Christopher [name] [phone] (Son) has been identified by the patient as the family member/significant other with whom the patient will be residing, and identified as the person(s) who will aid the patient in the event of a mental health crisis (suicidal ideations/suicide attempt).  With written consent from the patient, the family member/significant other has been provided the following suicide prevention education, prior to the and/or following the discharge of the patient. ? ?The suicide prevention education provided includes the following: ?Suicide risk factors ?Suicide prevention and interventions ?National Suicide Hotline telephone number ?[facility] assessment telephone number ?[facility] Emergency Assistance 911 ?[place] and/or Residential Mobile Crisis Unit telephone number ? ?Request made of family/significant other to: ?Remove weapons (e.g., guns, rifles, knives), all items previously/currently identified as safety concern.   ?Remove drugs/medications (over-the-counter, prescriptions, illicit drugs), all items previously/currently identified as a safety concern. ? ?The family member/significant other verbalizes understanding of the suicide prevention education information provided.  The family member/significant other agrees to remove the items of safety concern listed above. ? ?CSW spoke with Mr. Hughart who states that his father needs help with his mental health.  He states that his father "wants his life fixed quickly and gets upset when it does not go his way".  Mr. Cirrito states that his father changes his mind often and asks for a lot of help from his son and sister.  He states that his father has no firearms or weapons in his possession and confirms that he working at [employer] and is concerned about maintaining his job due to being in the hospital.  [place]  completed SPE with Mr. Rigler.  ? ?Darleen Crocker ?07/05/2021, 10:04 AM ?

## 2021-07-05 NOTE — Discharge Summary (Signed)
Physician Discharge Summary Note ? ?Patient:  Christopher CarbonRicky [name] is an 46 y.o., male ?MRN:  [id number] ?DOB:  08/02/1975 ?Patient phone:  [phone] (home)  ?Patient address:   ?[address] ?[place] [id number],  ?Total Time spent with patient: 20 minutes ? ?Date of Admission:  07/02/2021 ?Date of Discharge: 07/05/2021 ? ?Reason for Admission:  Patient is a 46 year old Caucasian male who presented voluntarily to the behavioral health urgent care for help due to worsening suicidal ideation.  While at the behavioral health urgent care, he reported thoughts of hanging himself as well as intermittent paranoid thoughts.  He was transferred to the behavioral health hospital voluntarily for continued inpatient stabilization. ? ?Principal Problem: MDD (major depressive disorder), recurrent, severe, with psychosis (HCC) ?Discharge Diagnoses: Principal Problem: ?  MDD (major depressive disorder), recurrent, severe, with psychosis (HCC) ?Active Problems: ?  Marijuana dependence (HCC) ?  PTSD (post-traumatic stress disorder) ?  Nicotine dependence ? ? ?Past Psychiatric History: See H&P ? ?Past Medical History:  ?Past Medical History:  ?Diagnosis Date  ? Substance abuse (HCC)   ?  ?Past Surgical History:  ?Procedure Laterality Date  ? FRACTURE SURGERY    ? ?Family History:  ?Family History  ?Family history unknown: Yes  ? ?Family Psychiatric  History: See H&P ?Social History:  ?Social History  ? ?Substance and Sexual Activity  ?Alcohol Use Not Currently  ?   ?Social History  ? ?Substance and Sexual Activity  ?Drug Use Yes  ? Types: Cocaine, Benzodiazepines, Marijuana  ? Comment: last used about 1 hour PTA  ?  ?Social History  ? ?Socioeconomic History  ? Marital status: Single  ?  Spouse name: Not on file  ? Number of children: Not on file  ? Years of education: Not on file  ? Highest education level: Not on file  ?Occupational History  ? Not on file  ?Tobacco Use  ? Smoking status: Every Day  ?  Packs/day: 1.00  ?  Years: 10.00  ?   Pack years: 10.00  ?  Types: Cigarettes  ? Smokeless tobacco: Current  ?Substance and Sexual Activity  ? Alcohol use: Not Currently  ? Drug use: Yes  ?  Types: Cocaine, Benzodiazepines, Marijuana  ?  Comment: last used about 1 hour PTA  ? Sexual activity: Not on file  ?Other Topics Concern  ? Not on file  ?Social History Narrative  ? Not on file  ? ?Social Determinants of Health  ? ?Financial Resource Strain: Not on file  ?Food Insecurity: Not on file  ?Transportation Needs: Not on file  ?Physical Activity: Not on file  ?Stress: Not on file  ?Social Connections: Not on file  ? ? ?Hospital Course:   ? ?During the patient's hospitalization, patient had extensive initial psychiatric evaluation, and follow-up psychiatric evaluations every day. ? ?Psychiatric diagnoses provided upon initial assessment:  ?  MDD (major depressive disorder), recurrent, severe, with psychosis (HCC) ?Active Problems: ?  Marijuana dependence (HCC) ?  PTSD (post-traumatic stress disorder) ?  ? ?Patient's psychiatric medications were adjusted on admission:  ?    -- Restart Zoloft 25mg  daily - patient states he did well on this med in the past  ?    -- Restart Neurontin 100mg  tid to help with anxiety and potential for relapse ?    -- Start Abilify 2mg  qd for paranoia  ?    -- Melatonin 3mg  qhs PRN insomnia ?    -- Vistaril 25mg  tid PRN anxiety ? ?Patient's care  was discussed during the interdisciplinary team meeting every day during the hospitalization. ? ?The patient denies having side effects to prescribed psychiatric medication. ? ?Gradually, patient started adjusting to milieu. The patient was evaluated each day by a clinical provider to ascertain response to treatment. Improvement was noted by the patient's report of decreasing symptoms, improved sleep and appetite, affect, medication tolerance, behavior, and participation in unit programming.  Patient was asked each day to complete a self inventory noting mood, mental status, pain, new  symptoms, anxiety and concerns.   ?Symptoms were reported as significantly decreased or resolved completely by discharge.  ?The patient reports that their mood is stable.  ?The patient denied having suicidal thoughts for more than 48 hours prior to discharge.  Patient denies having homicidal thoughts.  Patient denies having auditory hallucinations.  Patient denies any visual hallucinations or other symptoms of psychosis.  ?The patient was motivated to continue taking medication with a goal of continued improvement in mental health.  ? ?The patient reports their target psychiatric symptoms of SI, paranoia, and depressed mood responded well to the psychiatric medications, and the patient reports overall benefit other psychiatric hospitalization. Supportive psychotherapy was provided to the patient. The patient also participated in regular group therapy while hospitalized. Coping skills, problem solving as well as relaxation therapies were also part of the unit programming. ? ?Labs were reviewed with the patient, and abnormal results were discussed with the patient. ? ?The patient is able to verbalize their individual safety plan to this provider. ? ?# It is recommended to the patient to continue psychiatric medications as prescribed, after discharge from the hospital.   ? ?# It is recommended to the patient to follow up with your outpatient psychiatric provider and PCP. ? ?# It was discussed with the patient, the impact of alcohol, drugs, tobacco have been there overall psychiatric and medical wellbeing, and total abstinence from substance use was recommended the patient.ed. ? ?# Prescriptions provided or sent directly to preferred pharmacy at discharge. Patient agreeable to plan. Given opportunity to ask questions. Appears to feel comfortable with discharge.  ?  ?# In the event of worsening symptoms, the patient is instructed to call the crisis hotline, 911 and or go to the nearest ED for appropriate evaluation and  treatment of symptoms. To follow-up with primary care provider for other medical issues, concerns and or health care needs ? ?# Patient was discharged to sober living community with a plan to follow up as noted below.  ? ?Physical Findings: ?AIMS: Facial and Oral Movements ?Muscles of Facial Expression: None, normal ?Lips and Perioral Area: None, normal ?Jaw: None, normal ?Tongue: None, normal,Extremity Movements ?Upper (arms, wrists, hands, fingers): None, normal ?Lower (legs, knees, ankles, toes): None, normal, Trunk Movements ?Neck, shoulders, hips: None, normal, Overall Severity ?Severity of abnormal movements (highest score from questions above): None, normal ?Incapacitation due to abnormal movements: None, normal ?Patient's awareness of abnormal movements (rate only patient's report): No Awareness, Dental Status ?Current problems with teeth and/or dentures?: No ?Does patient usually wear dentures?: No  ? ?Musculoskeletal: ?Strength & Muscle Tone: within normal limits ?Gait & Station: normal, steady ?Patient leans: N/A ? ? ?Psychiatric Specialty Exam: ? ?Presentation  ?General Appearance: Appropriate for Environment; Casual ? ?Eye Contact:Good ? ?Speech:Clear and Coherent; Normal Rate ? ?Speech Volume:Normal ? ?Handedness:Right ? ? ?Mood and Affect  ?Mood:Euthymic ? ?Affect:Congruent ? ? ?Thought Process  ?Thought Processes:Coherent; Linear ? ?Descriptions of Associations:Intact ? ?Orientation:Full (Time, Place and Person) ? ?Thought Content:Logical (Denies SI/HI/AVH;  resolved paranoia. Does not voice delusions nor ideas of reference.) ? ?History of Schizophrenia/Schizoaffective disorder:No ? ?Hallucinations:Hallucinations: None ? ?Ideas of Reference:None ? ?Suicidal Thoughts:Suicidal Thoughts: No ? ?Homicidal Thoughts:Denies ? ?Sensorium  ?Memory:Immediate Fair; Recent Fair ? ?Judgment:Good ? ?Insight:Good ? ? ?Executive Functions  ?Concentration:Good ? ?Attention Span:Good ? ?Recall:Good ? ?Fund of  Knowledge:Good ? ?Language:Good ? ? ?Psychomotor Activity  ?Psychomotor Activity: Normal motor activity; normal gait. No tremors; AIMS 0. ? ?Assets  ?Assets:Communication Skills; Desire for Improvement; Housing; Resil

## 2021-07-05 NOTE — Progress Notes (Signed)
Discharge Note:  Patient discharged via friend to [facility] and work at 1:00 pm  Suicide prevention information given and discussed with patient who stated he understood and had no questions.  Patient denied SI and HI.  Denied A/V hallucinations.  Patient stated he received all his belongings, clothing, toiletries, misc items, etc.  Patient stated he appreciated all assistance received from [facility] staff.  All required discharge information given. ? ?

## 2021-07-05 NOTE — Plan of Care (Signed)
Nurse discussed coping skills with patient.  

## 2021-07-05 NOTE — Progress Notes (Signed)
?   07/04/21 2115  ?Psychosocial Assessment  ?Eye Contact Fair  ?Facial Expression Flat  ?Affect Appropriate to circumstance  ?Speech Logical/coherent  ?Interaction Minimal  ?Motor Activity Other (Comment) ?(wnl)  ?Appearance/Hygiene Unremarkable  ?Thought Process  ?Coherency WDL  ?Content WDL  ?Delusions None reported or observed  ?Perception WDL  ?Hallucination None reported or observed  ?Judgment WDL  ?Confusion None  ?Danger to Self  ?Current suicidal ideation?  ?(denies)  ?Agreement Not to Harm Self Yes  ?Description of Agreement verbal  ?Danger to Others  ?Danger to Others None reported or observed  ? ? ?

## 2021-07-05 NOTE — Progress Notes (Signed)
?  [facility] Adult Case Management Discharge Plan : ? ?Will you be returning to the same living situation after discharge:  Yes,  [facility] ?At discharge, do you have transportation home?: Yes,  [place] from [facility]  ?Do you have the ability to pay for your medications: Yes,  Community Support and Employment   ? ?Release of information consent forms completed and in the chart;  Patient's signature needed at discharge. ? ?Patient to Follow up at: ? Follow-up Information   ? ? [facility]. Schedule an appointment as soon as possible for a visit.   ?Why: Please call to schedule a hospital follow up appointment with this provider for primary care services as soon as possible. ?Contact information: ?[address] ?[place] [address] ?[phone] ? ?  ?  ? ? [facility]. Go on 07/06/2021.   ?Why: You have a hospital follow up appointment for therapy and medication management services on 07/06/21 at 8:30 am.  This appointment will be held in person. ?Contact information: ?[address] ?[place] [address] ?[phone] ? ? ?  ?  ? ?  ?  ? ?  ? ? ?Next level of care provider has access to [facility] Link:yes ? ?Safety Planning and Suicide Prevention discussed: Yes,  with patient and Son  ? ?  ? ?Has patient been referred to the Quitline?: Patient refused referral ? ?Patient has been referred for addiction treatment: Pt. refused referral ? ?Aram Beecham, LCSWA ?07/05/2021, 10:37 AM ?

## 2021-07-05 NOTE — BH IP Treatment Plan (Signed)
Interdisciplinary Treatment and Diagnostic Plan Update ? ?07/05/2021 ?Time of Session: 1;50pm ?Christopher [name] ?MRN: [id number] ? ?Principal Diagnosis: MDD (major depressive disorder), recurrent, severe, with psychosis (HCC) ? ?Secondary Diagnoses: Principal Problem: ?  MDD (major depressive disorder), recurrent, severe, with psychosis (HCC) ?Active Problems: ?  Marijuana dependence (HCC) ?  PTSD (post-traumatic stress disorder) ?  Nicotine dependence ? ? ?Current Medications:  ?Current Facility-Administered Medications  ?Medication Dose Route Frequency Provider Last Rate Last Admin  ? acetaminophen (TYLENOL) tablet 650 mg  650 mg Oral Q6H PRN Lauro FranklinPashayan, Alexander S, MD   650 mg at 07/04/21 [id number]  ? alum & mag hydroxide-simeth (MAALOX/MYLANTA) 200-200-20 MG/5ML suspension 30 mL  30 mL Oral Q4H PRN Lauro FranklinPashayan, Alexander S, MD      ? ARIPiprazole (ABILIFY) tablet 2 mg  2 mg Oral Daily Comer LocketSingleton, Amy E, MD   2 mg at 07/05/21 0747  ? benztropine (COGENTIN) tablet 0.5 mg  0.5 mg Oral BID PRN Comer LocketSingleton, Amy E, MD      ? feeding supplement (ENSURE ENLIVE / ENSURE PLUS) liquid 237 mL  237 mL Oral BID BM Mason JimSingleton, Amy E, MD   237 mL at 07/04/21 1021  ? gabapentin (NEURONTIN) capsule 100 mg  100 mg Oral TID Comer LocketSingleton, Amy E, MD   100 mg at 07/05/21 1126  ? hydrOXYzine (ATARAX) tablet 25 mg  25 mg Oral TID PRN Lauro FranklinPashayan, Alexander S, MD      ? OLANZapine zydis (ZYPREXA) disintegrating tablet 5 mg  5 mg Oral Q8H PRN Pashayan, Mardelle MatteAlexander S, MD      ? And  ? LORazepam (ATIVAN) tablet 1 mg  1 mg Oral PRN Lauro FranklinPashayan, Alexander S, MD      ? And  ? ziprasidone (GEODON) injection 20 mg  20 mg Intramuscular PRN Lauro FranklinPashayan, Alexander S, MD      ? magnesium hydroxide (MILK OF MAGNESIA) suspension 30 mL  30 mL Oral Daily PRN Lauro FranklinPashayan, Alexander S, MD      ? melatonin tablet 6 mg  6 mg Oral QHS PRN Bartholomew CrewsSingleton, Amy E, MD   6 mg at 07/04/21 2109  ? nicotine (NICODERM CQ - dosed in mg/24 hours) patch 21 mg  21 mg Transdermal Daily Mason JimSingleton, Amy E, MD   21 mg  at 07/05/21 [id number]  ? sertraline (ZOLOFT) tablet 25 mg  25 mg Oral Daily Comer LocketSingleton, Amy E, MD   25 mg at 07/05/21 0747  ? ?Current Outpatient Medications  ?Medication Sig Dispense Refill  ? [START ON 07/06/2021] ARIPiprazole (ABILIFY) 2 MG tablet Take 1 tablet (2 mg total) by mouth daily. 30 tablet 0  ? gabapentin (NEURONTIN) 100 MG capsule Take 1 capsule (100 mg total) by mouth 3 (three) times daily. 90 capsule 0  ? [START ON 07/06/2021] nicotine (NICODERM CQ - DOSED IN MG/24 HOURS) 21 mg/24hr patch Place 1 patch (21 mg total) onto the skin daily. 28 patch 0  ? [START ON 07/06/2021] sertraline (ZOLOFT) 25 MG tablet Take 1 tablet (25 mg total) by mouth daily. 30 tablet 0  ? ?PTA Medications: ?No medications prior to admission.  ? ? ?Patient Stressors:   ? ?Patient Strengths:   ? ?Treatment Modalities: Medication Management, Group therapy, Case management,  ?1 to 1 session with clinician, Psychoeducation, Recreational therapy. ? ? ?Physician Treatment Plan for Primary Diagnosis: MDD (major depressive disorder), recurrent, severe, with psychosis (HCC) ?Long Term Goal(s): Improvement in symptoms so as ready for discharge  ? ?Short Term Goals: Ability to identify triggers associated with substance  abuse/mental health issues will improve ?Ability to verbalize feelings will improve ?Ability to disclose and discuss suicidal ideas ?Ability to identify and develop effective coping behaviors will improve ?Ability to maintain clinical measurements within normal limits will improve ? ?Medication Management: Evaluate patient's response, side effects, and tolerance of medication regimen. ? ?Therapeutic Interventions: 1 to 1 sessions, Unit Group sessions and Medication administration. ? ?Evaluation of Outcomes: Adequate for Discharge ? ?Physician Treatment Plan for Secondary Diagnosis: Principal Problem: ?  MDD (major depressive disorder), recurrent, severe, with psychosis (HCC) ?Active Problems: ?  Marijuana dependence (HCC) ?  PTSD  (post-traumatic stress disorder) ?  Nicotine dependence ? ?Long Term Goal(s): Improvement in symptoms so as ready for discharge  ? ?Short Term Goals: Ability to identify triggers associated with substance abuse/mental health issues will improve ?Ability to verbalize feelings will improve ?Ability to disclose and discuss suicidal ideas ?Ability to identify and develop effective coping behaviors will improve ?Ability to maintain clinical measurements within normal limits will improve    ? ?Medication Management: Evaluate patient's response, side effects, and tolerance of medication regimen. ? ?Therapeutic Interventions: 1 to 1 sessions, Unit Group sessions and Medication administration. ? ?Evaluation of Outcomes: Adequate for Discharge ? ? ?RN Treatment Plan for Primary Diagnosis: MDD (major depressive disorder), recurrent, severe, with psychosis (HCC) ?Long Term Goal(s): Knowledge of disease and therapeutic regimen to maintain health will improve ? ?Short Term Goals: Ability to remain free from injury will improve, Ability to verbalize frustration and anger appropriately will improve, Ability to demonstrate self-control, Ability to identify and develop effective coping behaviors will improve, and Compliance with prescribed medications will improve ? ?Medication Management: RN will administer medications as ordered by provider, will assess and evaluate patient's response and provide education to patient for prescribed medication. RN will report any adverse and/or side effects to prescribing provider. ? ?Therapeutic Interventions: 1 on 1 counseling sessions, Psychoeducation, Medication administration, Evaluate responses to treatment, Monitor vital signs and CBGs as ordered, Perform/monitor CIWA, COWS, AIMS and Fall Risk screenings as ordered, Perform wound care treatments as ordered. ? ?Evaluation of Outcomes: Adequate for Discharge ? ? ?LCSW Treatment Plan for Primary Diagnosis: MDD (major depressive disorder),  recurrent, severe, with psychosis (HCC) ?Long Term Goal(s): Safe transition to appropriate next level of care at discharge, Engage patient in therapeutic group addressing interpersonal concerns. ? ?Short Term Goals: Engage patient in aftercare planning with referrals and resources, Increase social support, Increase ability to appropriately verbalize feelings, Increase emotional regulation, and Increase skills for wellness and recovery ? ?Therapeutic Interventions: Assess for all discharge needs, 1 to 1 time with Child psychotherapist, Explore available resources and support systems, Assess for adequacy in community support network, Educate family and significant other(s) on suicide prevention, Complete Psychosocial Assessment, Interpersonal group therapy. ? ?Evaluation of Outcomes: Adequate for Discharge ? ? ?Progress in Treatment: ?Attending groups: Yes. ?Participating in groups: Yes. ?Taking medication as prescribed: Yes. ?Toleration medication: Yes. ?Family/Significant other contact made: Yes, individual(s) contacted:  with son ?Patient understands diagnosis: Yes. ?Discussing patient identified problems/goals with staff: Yes. ?Medical problems stabilized or resolved: Yes. ?Denies suicidal/homicidal ideation: Yes. ?Issues/concerns per patient self-inventory: No. ? ? ?New problem(s) identified: No, Describe:  none ? ?New Short Term/Long Term Goal(s): detox, medication management for mood stabilization; elimination of SI thoughts; development of comprehensive mental wellness/sobriety plan ? ? ?Patient Goals:  did not attend ? ?Discharge Plan or Barriers: Patient is to follow up at [facility] for therapy and medication management ? ?Reason for Continuation of Hospitalization: Medication  stabilization ? ?Estimated Length of Stay: Adequate for discharge ? ?Last 3 [place] Suicide Severity Risk Score: ?Flowsheet Row [facility] ?Most recent reading at 07/02/2021   4:00 PM ED from 07/02/2021 in [facility] ?Most recent reading at 07/02/2021 10:37 AM [facility] (Discharged[facility] ?Most re

## 2021-07-05 NOTE — Progress Notes (Signed)
? ?Subjective:  ?Patient ID: Christopher [name], male    DOB: 1975/07/11  Age: 46 y.o. MRN: [id number] ? ?CC: Hospitalization Follow-up ? ? ?HPI ?Christopher [name] is a 46 y.o. year old male with a history of tobacco dependence, substance abuse, major depressive disorder, PTSD who presents today to establish care.  He was hospitalized 06/24/2021 through 07/05/2021 after he had presented with worsening suicidal ideation and paranoia. ?He was commenced on Abilify, Zoloft, gabapentin. ?Labs had revealed polycythemia with a hemoglobin of 17.4. ? ?Interval History: ?He has an upcoming appt with Psych tomorrow morning.  States he feels better than when he was admitted but he is not back to his baseline. ? ?Smokes 1 ppd Cigarettes/  day since he was 46 years old and is open to work on quitting. ?He complains of fatigue, low energy which she has noticed for the past 6 months even when he has had a good nights rest.  He is unsure if this is related to his depression. ?Denies additional concerns. ?Past Medical History:  ?Diagnosis Date  ? Substance abuse ([place])   ? ? ?Past Surgical History:  ?Procedure Laterality Date  ? FRACTURE SURGERY    ? ? ?Family History  ?Family history unknown: Yes  ? ? ?Social History  ? ?Socioeconomic History  ? Marital status: Single  ?  Spouse name: Not on file  ? Number of children: Not on file  ? Years of education: Not on file  ? Highest education level: Not on file  ?Occupational History  ? Not on file  ?Tobacco Use  ? Smoking status: Every Day  ?  Packs/day: 1.00  ?  Years: 10.00  ?  Pack years: 10.00  ?  Types: Cigarettes  ? Smokeless tobacco: Current  ?Substance and Sexual Activity  ? Alcohol use: Not Currently  ? Drug use: Yes  ?  Types: Cocaine, Benzodiazepines, Marijuana  ?  Comment: last used about 1 hour PTA  ? Sexual activity: Not on file  ?Other Topics Concern  ? Not on file  ?Social History Narrative  ? Not on file  ? ?Social Determinants of Health  ? ?Financial Resource Strain: Not on file  ?Food  Insecurity: Not on file  ?Transportation Needs: Not on file  ?Physical Activity: Not on file  ?Stress: Not on file  ?Social Connections: Not on file  ? ? ?Allergies  ?Allergen Reactions  ? Ibuprofen Hives, Itching, Nausea And Vomiting, Swelling and Other (See Comments)  ?  Angioedema ?  ? Trazodone Anaphylaxis, Hives, Swelling and Other (See Comments)  ?  Angioedema (facial swelling) and swollen spots appeared on the body  ? ? ?Outpatient Medications Prior to Visit  ?Medication Sig Dispense Refill  ? [START ON 07/06/2021] ARIPiprazole (ABILIFY) 2 MG tablet Take 1 tablet (2 mg total) by mouth daily. 30 tablet 0  ? gabapentin (NEURONTIN) 100 MG capsule Take 1 capsule (100 mg total) by mouth 3 (three) times daily. 90 capsule 0  ? [START ON 07/06/2021] sertraline (ZOLOFT) 25 MG tablet Take 1 tablet (25 mg total) by mouth daily. 30 tablet 0  ? [START ON 07/06/2021] nicotine (NICODERM CQ - DOSED IN MG/24 HOURS) 21 mg/24hr patch Place 1 patch (21 mg total) onto the skin daily. (Patient not taking: Reported on 07/05/2021) 28 patch 0  ? ?No facility-administered medications prior to visit.  ? ? ? ?ROS ?Review of Systems  ?Constitutional:  Negative for activity change and appetite change.  ?HENT:  Negative for sinus pressure and sore  throat.   ?Eyes:  Negative for visual disturbance.  ?Respiratory:  Negative for cough, chest tightness and shortness of breath.   ?Cardiovascular:  Negative for chest pain and leg swelling.  ?Gastrointestinal:  Negative for abdominal distention, abdominal pain, constipation and diarrhea.  ?Endocrine: Negative.   ?Genitourinary:  Negative for dysuria.  ?Musculoskeletal:  Negative for joint swelling and myalgias.  ?Skin:  Negative for rash.  ?Allergic/Immunologic: Negative.   ?Neurological:  Negative for weakness, light-headedness and numbness.  ?Psychiatric/Behavioral:  Negative for dysphoric mood and suicidal ideas.   ? ?Objective:  ?BP 123/85   Pulse 97   Ht 6\' 2"  (1.88 m)   Wt 209 lb 6.4 oz (95 kg)    SpO2 99%   BMI 26.89 kg/m?  ? ? ?  07/05/2021  ?  2:50 PM 07/05/2021  ?  6:04 AM 07/05/2021  ?  6:01 AM  ?BP/Weight  ?Systolic BP [id number]  ?Diastolic BP 85 88 81  ?Wt. (Lbs) 209.4    ?BMI 26.89 kg/m2    ? ? ? ? ?Physical Exam ?Constitutional:   ?   Appearance: He is well-developed.  ?Cardiovascular:  ?   Rate and Rhythm: Normal rate.  ?   Heart sounds: Normal heart sounds. No murmur heard. ?Pulmonary:  ?   Effort: Pulmonary effort is normal.  ?   Breath sounds: Normal breath sounds. No wheezing or rales.  ?Chest:  ?   Chest wall: No tenderness.  ?Abdominal:  ?   General: Bowel sounds are normal. There is no distension.  ?   Palpations: Abdomen is soft. There is no mass.  ?   Tenderness: There is no abdominal tenderness.  ?Musculoskeletal:     ?   General: Normal range of motion.  ?   Right lower leg: No edema.  ?   Left lower leg: No edema.  ?Neurological:  ?   Mental Status: He is alert and oriented to person, place, and time.  ?Psychiatric:     ?   Mood and Affect: Mood normal.  ? ? ? ?  Latest Ref Rng & Units 07/02/2021  ? 12:35 PM 01/04/2019  ?  2:46 PM 01/04/2019  ?  2:23 PM  ?CMP  ?Glucose 70 - 99 mg/dL 77    95    ?BUN 6 - 20 mg/dL 9    12    ?Creatinine 0.61 - 1.24 mg/dL 0.73    0.90    ?Sodium 135 - 145 mmol/L 139    137    ?Potassium 3.5 - 5.1 mmol/L 4.3    4.4    ?Chloride 98 - 111 mmol/L 100    102    ?CO2 22 - 32 mmol/L 31      ?Calcium 8.9 - 10.3 mg/dL 9.2      ?Total Protein 6.5 - 8.1 g/dL 6.9   7.6     ?Total Bilirubin 0.3 - 1.2 mg/dL 0.5   1.0     ?Alkaline Phos 38 - 126 U/L 65   60     ?AST 15 - 41 U/L 13   76     ?ALT 0 - 44 U/L 16   54     ? ? ?Lipid Panel  ?   ?Component Value Date/Time  ? CHOL 189 07/03/2021 0639  ? TRIG 96 07/03/2021 0639  ? HDL 48 07/03/2021 0639  ? CHOLHDL 3.9 07/03/2021 0639  ? VLDL 19 07/03/2021 0639  ? [place] 122 (H) 07/03/2021 [id number]  ? ? ?  CBC ?   ?Component Value Date/Time  ? WBC 10.7 (H) 07/02/2021 1235  ? RBC 6.00 (H) 07/02/2021 1235  ? HGB 17.7 (H) 07/02/2021 1235  ?  HCT 54.7 (H) 07/02/2021 1235  ? PLT 365 07/02/2021 1235  ? MCV 91.2 07/02/2021 1235  ? MCH 29.5 07/02/2021 1235  ? MCHC 32.4 07/02/2021 1235  ? RDW 12.7 07/02/2021 1235  ? LYMPHSABS 2.7 07/02/2021 1235  ? MONOABS 0.5 07/02/2021 1235  ? EOSABS 0.1 07/02/2021 1235  ? BASOSABS 0.1 07/02/2021 1235  ? ? ?Lab Results  ?Component Value Date  ? HGBA1C 5.6 07/03/2021  ? ? ? ?Assessment & Plan:  ?1. 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 ?2. MDD (major depressive disorder), severe ([place]) ?History of suicidal ideation ?Discharged from inpatient psych care today ?He will follow-up with psychiatry tomorrow morning ?Continue psychotropic medications ? ?3. Cigarette nicotine dependence without complication ?Spent 3 minutes counseling on smoking cessation and he is willing to work on quitting ?- nicotine (NICODERM CQ - DOSED IN MG/24 HOURS) 21 mg/24hr patch; Place 1 patch (21 mg total) onto the skin daily. Then decrease to 14mg /day thereafter  Dispense: 28 patch; Refill: 0 ? ?4. Chronic fatigue ?Unknown etiology ?We will check for underlying causes ?- VITAMIN D 25 Hydroxy (Vit-D Deficiency, Fractures) ?- T4, free ?- TSH ? ?5. Screening for viral disease ?- HCV Ab w Reflex to Quant PCR ?- HIV Antibody (routine testing w rflx) ? ? ?Health Care Maintenance: He requested a complete physical exam and this will be scheduled for his next visit. ? ?Meds ordered this encounter  ?Medications  ? nicotine (NICODERM CQ - DOSED IN MG/24 HOURS) 21 mg/24hr patch  ?  Sig: Place 1 patch (21 mg total) onto the skin daily. Then decrease to 14mg /day thereafter  ?  Dispense:  28 patch  ?  Refill:  0  ? ? ?Follow-up: Return in about 1 month (around 08/05/2021) for CPE/ Preventive Health Exam.  ? ? ? ? ? ?Charlott Rakes, MD, FAAFP. ?[facility] ?[place], [place] ?[phone]   ?07/05/2021, 3:17 PM ?

## 2021-07-05 NOTE — Patient Instructions (Signed)

## 2021-07-05 NOTE — Progress Notes (Signed)
D:   Patient's self inventory sheet, patient has fair sleep, sleep medication not helpful.  Good appetite, normal energy level, good concentration.  Rated depression 3, hopeless 1, denied anxiety.  Denied SI .  Denied physical problems.  Denied physical pain.  Goal is discharge and work.  Plans to stay positive.  Does have discharge plans. ?A:  Medications administered per MD orders.  Emotional support and encouragement given patient. ?R:  Denied SI and HI, contracts for safety.  Denied A/V hallucinations.  Safety maintained with 15 minute checks. ? ?

## 2021-07-05 NOTE — [facility]HH Suicide Risk Assessment (Signed)
Suicide Risk Assessment ? ?Discharge Assessment    ?[facility] Discharge Suicide Risk Assessment ? ? ?Principal Problem: MDD (major depressive disorder), recurrent, severe, with psychosis ([place]) ?Discharge Diagnoses: Principal Problem: ?  MDD (major depressive disorder), recurrent, severe, with psychosis ([place]) ?Active Problems: ?  Marijuana dependence ([place]) ?  PTSD (post-traumatic stress disorder) ?  Nicotine dependence ? ? ?Total Time spent with patient: 20 minutes ? ?Patient is a 46 year old Caucasian male who presented voluntarily to the behavioral health urgent care for help due to worsening suicidal ideation.  While at the behavioral health urgent care, he reported thoughts of hanging himself as well as intermittent paranoid thoughts.  He was transferred to the behavioral health hospital voluntarily for continued inpatient stabilization. ? ?Musculoskeletal: ?Strength & Muscle Tone: within normal limits ?Gait & Station: normal ?Patient leans: N/A ? ?Psychiatric Specialty Exam ? ?Presentation  ?General Appearance: Appropriate for Environment; Casual ? ?Eye Contact:Good ? ?Speech:Clear and Coherent; Normal Rate ? ?Speech Volume:Normal ? ?Handedness:Right ? ? ?Mood and Affect  ?Mood:Euthymic ? ?Duration of Depression Symptoms: Greater than two weeks ? ?Affect:Congruent ? ? ?Thought Process  ?Thought Processes:Coherent; Linear ? ?Descriptions of Associations:Intact ? ?Orientation:Full (Time, Place and Person) ? ?Thought Content:Logical (Denies SI/HI/AVH; resolved paranoia. Does not voice delusions nor ideas of reference.) ? ?History of Schizophrenia/Schizoaffective disorder:No ? ?Duration of Psychotic Symptoms:No data recorded ?Hallucinations:Hallucinations: None ? ?Ideas of Reference:None ? ?Suicidal Thoughts:Suicidal Thoughts: No ? ?Homicidal Thoughts:Homicidal Thoughts: No ? ? ?Sensorium  ?Memory:Immediate Fair; Recent Fair ? ?Judgment:Good ? ?Insight:Good ? ? ?Executive Functions  ?Concentration:Good ? ?Attention  Span:Good ? ?Recall:Good ? ?Fund of [place] ? ?Language:Good ? ? ?Psychomotor Activity  ?Psychomotor Activity:Psychomotor Activity: Normal (AIMS 0; no tremors) ? ?Assets  ?Assets:Communication Skills; Desire for Improvement; Housing; Resilience; Social Support; Vocational/Educational ? ? ?Sleep  ?Sleep:Sleep: Fair ? ?Physical Exam: ?Physical Exam See Discharge summary ?ROS See discharge summary ?Blood pressure 125/88, pulse (!) 101, temperature 98.6 ?F (37 ?C), temperature source Oral, resp. rate 18, height 6\' 2"  (1.88 m), weight 93.4 kg, SpO2 96 %. Body mass index is 26.45 kg/m?. ? ?Mental Status Per Nursing Assessment::   ?On Admission:  Plan includes specific time, place, or method ? ?Demographic Factors:  ?Male, Caucasian, and married but separated ? ?Loss Factors: ?Loss of significant relationship (separated from wife) ? ?Historical Factors: ?Prior suicide attempts and substance use prior to admission ? ?Risk Reduction Factors:   ?Sense of responsibility to family, Living with another person in a sober living community, Positive social support ? ?Continued Clinical Symptoms:  ? ? ?Cognitive Features That Contribute To Risk:  ?None   ? ?Suicide Risk:  ?Mild:  There are no identifiable plans, no associated intent, mild dysphoria and related symptoms, good self-control (both objective and subjective assessment), few other risk factors, and identifiable protective factors, including available and accessible social support. ? ? Follow-up Information   ? ? [facility]. Schedule an appointment as soon as possible for a visit.   ?Why: Please call to schedule a hospital follow up appointment with this provider for primary care services as soon as possible. ?Contact information: ?[place] ?[place] [phone] ?[phone] ? ?  ?  ? ? [facility], [facility]. Go on 07/06/2021.   ?Why: You have a hospital follow up appointment for therapy and  medication management services on 07/06/21 at 8:30 am.  This appointment will be held in person. ?Contact information: ?[place] [address] ?[phone] ? ? ?  ?  ? ?  ?  ? ?  ? ? ?  Plan Of Care/Follow-up recommendations:  ?Activity: as tolerated ? ?Diet: heart healthy ? ?Other: ?-Follow-up with your outpatient psychiatric provider -instructions on appointment date, time, and address (location) are provided to you in discharge paperwork. ? ?-Take your psychiatric medications as prescribed at discharge - instructions are provided to you in the discharge paperwork ? ?-Follow-up with outpatient primary care doctor and other specialists -for management of chronic medical disease, including: hyperlipidemia ? ?-Testing: Follow-up with outpatient provider for abnormal lab results: abnormal lipid panel with LDL 122 ? ?-Recommend abstinence from alcohol, tobacco, and other illicit drug use at discharge.  ? ?-If your psychiatric symptoms recur, worsen, or if you have side effects to your psychiatric medications, call your outpatient psychiatric provider, 911, [phone] or go to the nearest emergency department. ? ?-If suicidal thoughts recur, call your outpatient psychiatric provider, 911, [phone] or go to the nearest emergency department.  ? ?Rosezetta Schlatter, MD ?07/05/2021, 11:03 AM ?

## 2021-07-05 NOTE — Discharge Instructions (Signed)
Activity: as tolerated ?  ?Diet: heart healthy ?  ?Other: ?-Follow-up with your outpatient psychiatric provider -instructions on appointment date, time, and address (location) are provided to you in discharge paperwork. ?  ?-Take your psychiatric medications as prescribed at discharge - instructions are provided to you in the discharge paperwork ?  ?-Follow-up with outpatient primary care doctor and other specialists -for management of chronic medical disease, including: hyperlipidemia (elevated "bad" cholesterol). ?  ?-Testing: Follow-up with outpatient provider for abnormal lab results: abnormal lipid panel with LDL 122 (high "bad" cholesterol). ?  ?-Recommend abstinence from alcohol, tobacco, and other illicit drug use at discharge.  ?  ?-If your psychiatric symptoms recur, worsen, or if you have side effects to your psychiatric medications, call your outpatient psychiatric provider, 911, [phone] or go to the nearest emergency department. ?  ?-If suicidal thoughts recur, call your outpatient psychiatric provider, 911, [phone] or go to the nearest emergency department.  ?  ?

## 2021-07-06 ENCOUNTER — Other Ambulatory Visit: Payer: Self-pay

## 2021-07-06 ENCOUNTER — Other Ambulatory Visit: Payer: Self-pay | Admitting: Family Medicine

## 2021-07-06 LAB — T4, FREE: Free T4: 1.17 ng/dL (ref 0.82–1.77)

## 2021-07-06 LAB — HCV INTERPRETATION

## 2021-07-06 LAB — TSH: TSH: 6 u[IU]/mL — ABNORMAL HIGH (ref 0.450–4.500)

## 2021-07-06 LAB — HIV ANTIBODY (ROUTINE TESTING W REFLEX): HIV Screen 4th Generation wRfx: NONREACTIVE

## 2021-07-06 LAB — VITAMIN D 25 HYDROXY (VIT D DEFICIENCY, FRACTURES): Vit D, 25-Hydroxy: 13.1 ng/mL — ABNORMAL LOW (ref 30.0–100.0)

## 2021-07-06 LAB — HCV AB W REFLEX TO QUANT PCR: HCV Ab: NONREACTIVE

## 2021-07-06 MED ORDER — ERGOCALCIFEROL 1.25 MG (50000 UT) PO CAPS
50000.0000 [IU] | ORAL_CAPSULE | ORAL | 1 refills | Status: AC
  Filled 2021-07-06: qty 12, 84d supply, fill #0

## 2021-07-07 ENCOUNTER — Telehealth: Payer: Self-pay

## 2021-07-07 NOTE — Telephone Encounter (Signed)
Copied from CRM [id number]. Topic: General - Inquiry ?>> Jul 07, 2021 10:11 AM Aretta Nip wrote: ?Reason for CRM: Pt had labs drawn by [facility] and have been released, but not stated for NT to quote, pls fu with pt at [phone] ?

## 2021-07-07 NOTE — Telephone Encounter (Signed)
Contacted pt to go over lab results. Pt didn't answer lvm  ?

## 2021-07-13 ENCOUNTER — Other Ambulatory Visit: Payer: Self-pay

## 2021-07-14 ENCOUNTER — Ambulatory Visit (HOSPITAL_COMMUNITY): Payer: No Payment, Other | Admitting: Licensed Clinical Social Worker

## 2021-08-10 ENCOUNTER — Ambulatory Visit: Payer: Self-pay | Admitting: Family Medicine

## 2021-11-23 ENCOUNTER — Ambulatory Visit: Payer: Self-pay | Admitting: Family Medicine
